# Patient Record
Sex: Male | Born: 1950 | Race: White | Hispanic: No | Marital: Married | Smoking: Never smoker
Health system: Southern US, Community
[De-identification: ages and names within clinical notes are randomized; demographics above are authoritative.]

## PROBLEM LIST (undated history)

## (undated) DIAGNOSIS — E7800 Pure hypercholesterolemia, unspecified: Secondary | ICD-10-CM

## (undated) DIAGNOSIS — E559 Vitamin D deficiency, unspecified: Secondary | ICD-10-CM

## (undated) DIAGNOSIS — M1990 Unspecified osteoarthritis, unspecified site: Secondary | ICD-10-CM

## (undated) DIAGNOSIS — K219 Gastro-esophageal reflux disease without esophagitis: Secondary | ICD-10-CM

## (undated) DIAGNOSIS — E538 Deficiency of other specified B group vitamins: Secondary | ICD-10-CM

## (undated) DIAGNOSIS — I10 Essential (primary) hypertension: Secondary | ICD-10-CM

## (undated) DIAGNOSIS — Z9109 Other allergy status, other than to drugs and biological substances: Secondary | ICD-10-CM

## (undated) HISTORY — PX: OTHER SURGICAL HISTORY: SHX169

## (undated) HISTORY — PX: COLONOSCOPY WITH PROPOFOL: SHX5780

## (undated) HISTORY — DX: Vitamin D deficiency, unspecified: E55.9

## (undated) HISTORY — DX: Deficiency of other specified B group vitamins: E53.8

## (undated) HISTORY — PX: SKIN GRAFT: SHX250

## (undated) HISTORY — DX: Essential (primary) hypertension: I10

## (undated) HISTORY — PX: TONSILLECTOMY: SUR1361

---

## 2012-02-17 ENCOUNTER — Other Ambulatory Visit: Payer: Self-pay | Admitting: Allergy and Immunology

## 2012-02-17 ENCOUNTER — Ambulatory Visit
Admission: RE | Admit: 2012-02-17 | Discharge: 2012-02-17 | Disposition: A | Discharge status: Home / Self Care | Payer: BC Managed Care – PPO | Source: Ambulatory Visit | Attending: Allergy and Immunology | Admitting: Allergy and Immunology

## 2012-02-17 DIAGNOSIS — R059 Cough, unspecified: Secondary | ICD-10-CM

## 2012-02-17 DIAGNOSIS — R05 Cough: Secondary | ICD-10-CM

## 2014-01-30 IMAGING — CR DG CHEST 2V
2 series · 2 of 2 positions shown · non-contrast
Comparison: None.

CLINICAL DATA: Cough, shortness of breath, former smoking history

CHEST - 2 VIEW

[w chest pa]
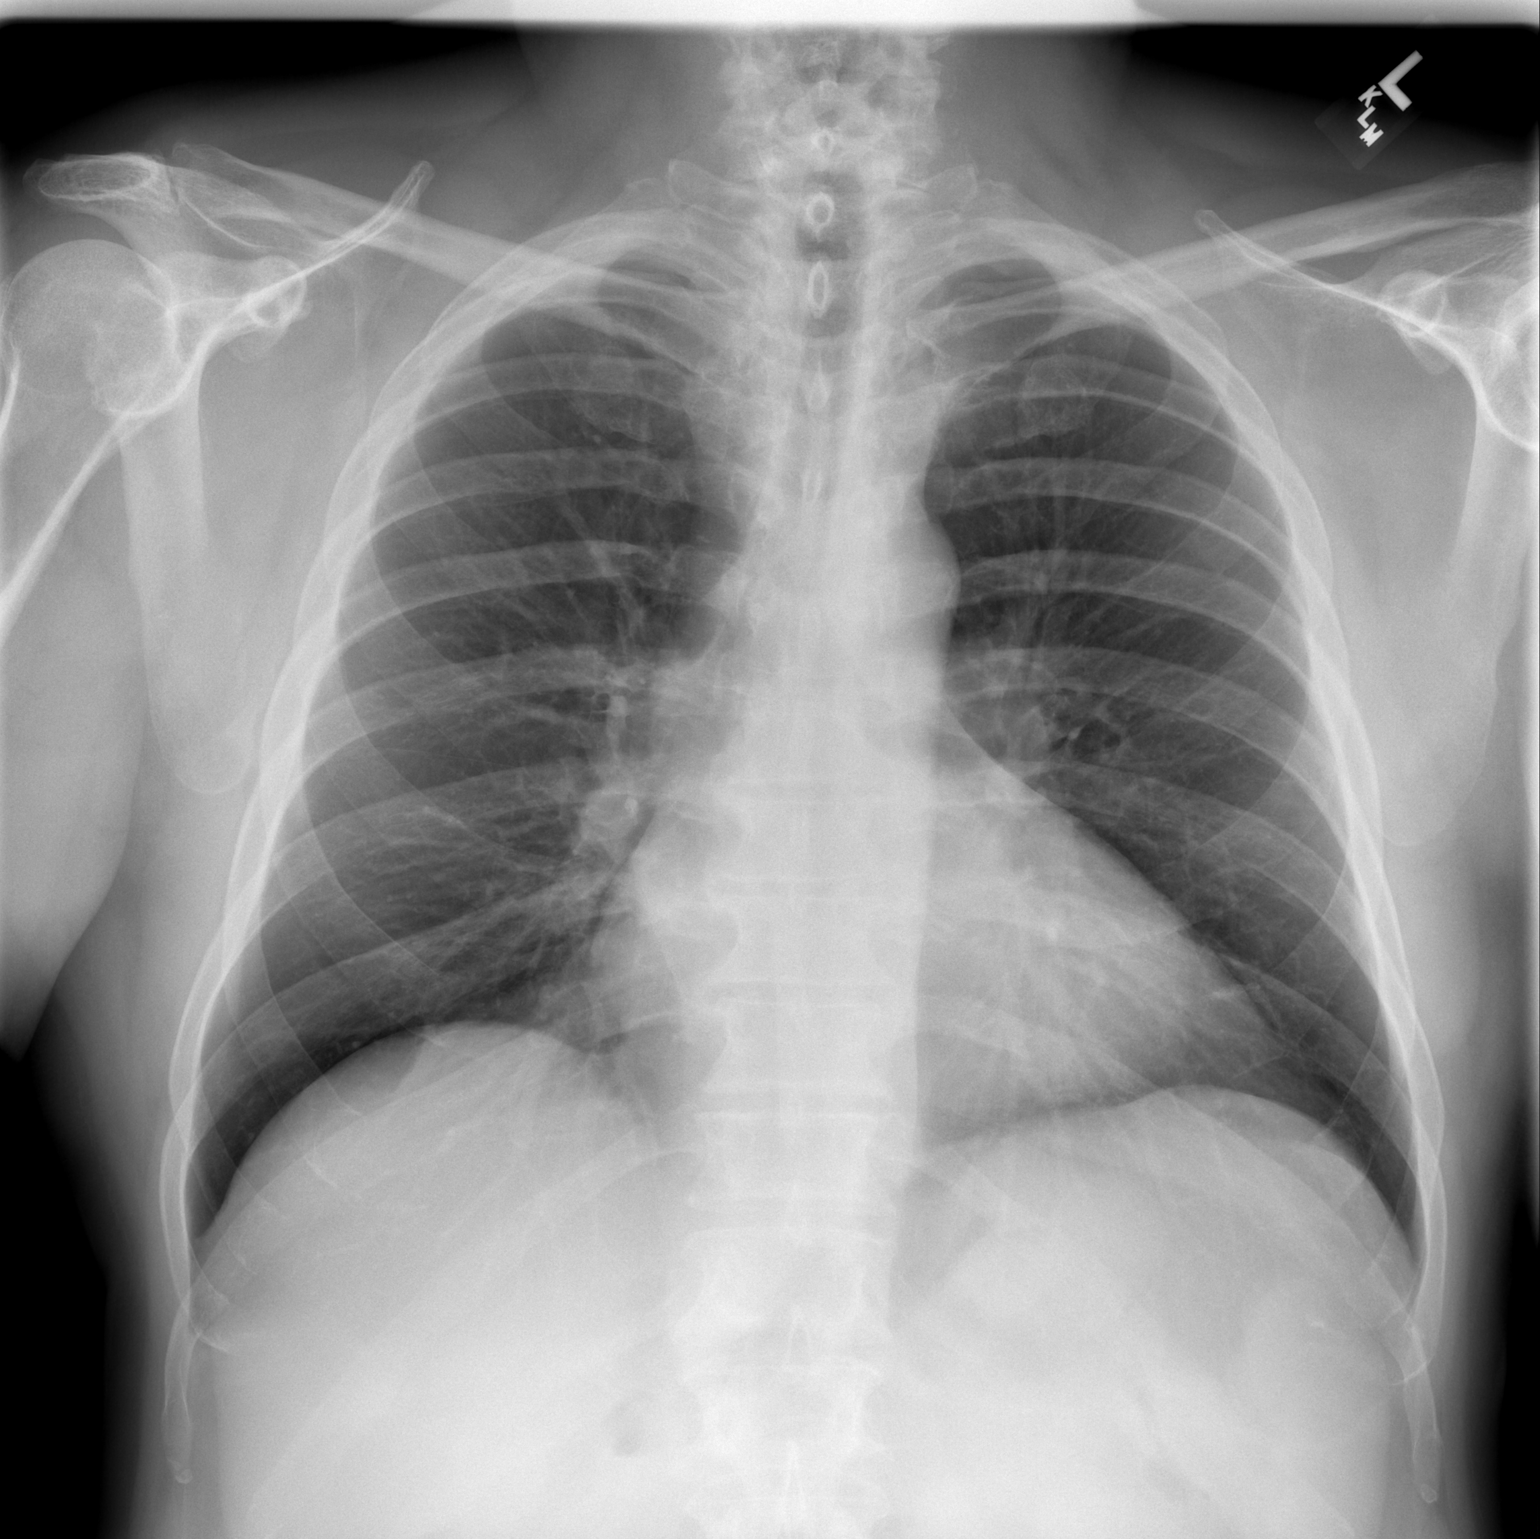

[w chest lat]
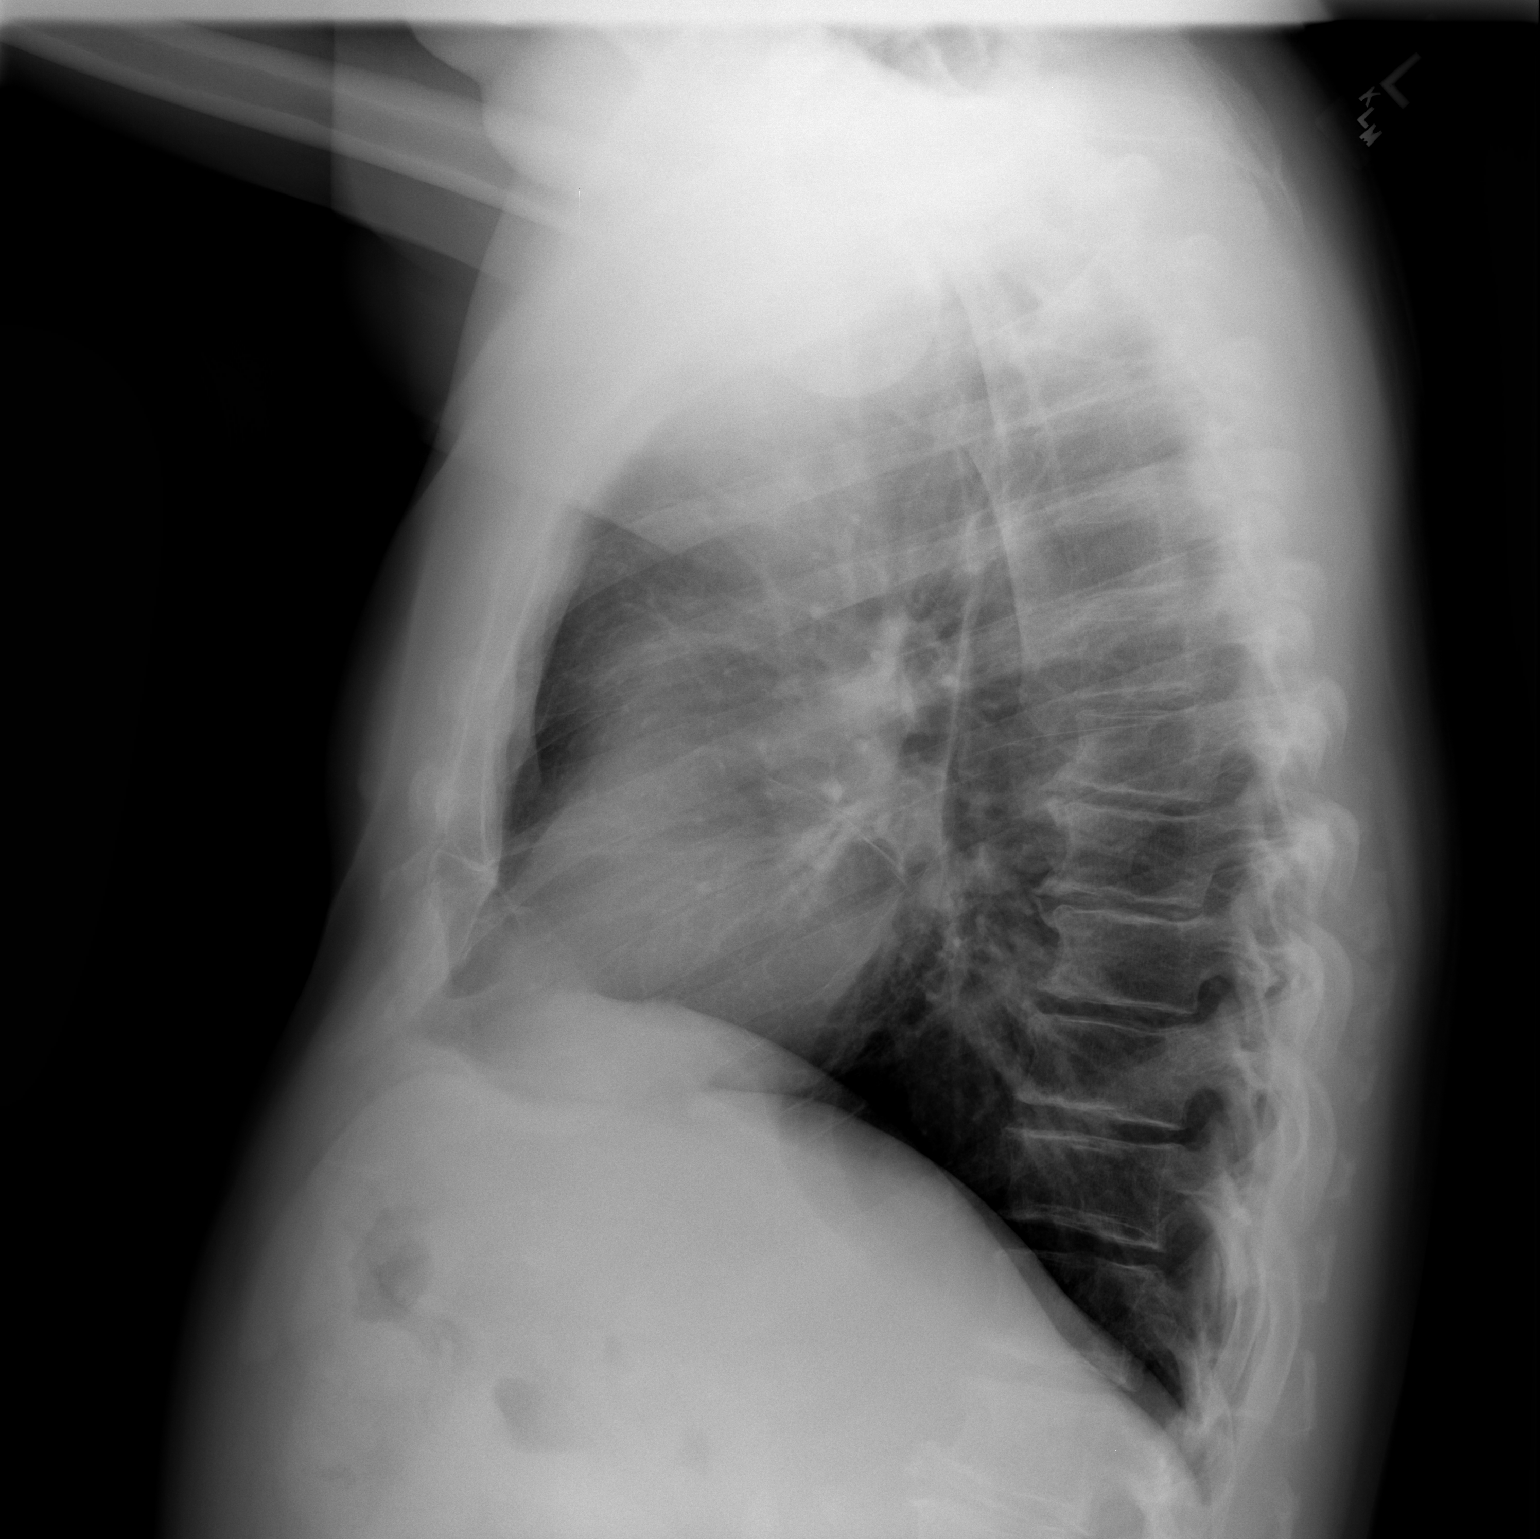

[2 of 2 positions shown; findings below may reference images not displayed]

FINDINGS: The lungs are clear.  Mediastinal contours appear normal.
The heart is within normal limits in size.  There are degenerative
changes throughout the thoracic spine.
IMPRESSION: No active lung disease.

## 2016-07-28 DIAGNOSIS — Z23 Encounter for immunization: Secondary | ICD-10-CM | POA: Diagnosis not present

## 2016-07-28 DIAGNOSIS — M109 Gout, unspecified: Secondary | ICD-10-CM | POA: Diagnosis not present

## 2016-07-28 DIAGNOSIS — R739 Hyperglycemia, unspecified: Secondary | ICD-10-CM | POA: Diagnosis not present

## 2016-07-28 DIAGNOSIS — M2550 Pain in unspecified joint: Secondary | ICD-10-CM | POA: Diagnosis not present

## 2016-07-28 DIAGNOSIS — K219 Gastro-esophageal reflux disease without esophagitis: Secondary | ICD-10-CM | POA: Diagnosis not present

## 2016-07-28 DIAGNOSIS — E559 Vitamin D deficiency, unspecified: Secondary | ICD-10-CM | POA: Diagnosis not present

## 2016-07-28 DIAGNOSIS — E7800 Pure hypercholesterolemia, unspecified: Secondary | ICD-10-CM | POA: Diagnosis not present

## 2016-07-28 DIAGNOSIS — J309 Allergic rhinitis, unspecified: Secondary | ICD-10-CM | POA: Diagnosis not present

## 2016-07-28 DIAGNOSIS — Z125 Encounter for screening for malignant neoplasm of prostate: Secondary | ICD-10-CM | POA: Diagnosis not present

## 2016-07-28 DIAGNOSIS — Z87898 Personal history of other specified conditions: Secondary | ICD-10-CM | POA: Diagnosis not present

## 2016-07-28 DIAGNOSIS — Z1389 Encounter for screening for other disorder: Secondary | ICD-10-CM | POA: Diagnosis not present

## 2016-07-28 DIAGNOSIS — G4700 Insomnia, unspecified: Secondary | ICD-10-CM | POA: Diagnosis not present

## 2016-07-28 DIAGNOSIS — Z0001 Encounter for general adult medical examination with abnormal findings: Secondary | ICD-10-CM | POA: Diagnosis not present

## 2016-09-11 ENCOUNTER — Other Ambulatory Visit: Payer: Self-pay | Admitting: Gastroenterology

## 2016-10-27 ENCOUNTER — Encounter (HOSPITAL_COMMUNITY): Payer: Self-pay | Admitting: *Deleted

## 2016-10-28 ENCOUNTER — Encounter (HOSPITAL_COMMUNITY)
Admission: RE | Disposition: A | Discharge status: Home / Self Care | Payer: Self-pay | Source: Ambulatory Visit | Attending: Gastroenterology

## 2016-10-28 ENCOUNTER — Encounter (HOSPITAL_COMMUNITY): Payer: Self-pay

## 2016-10-28 ENCOUNTER — Ambulatory Visit (HOSPITAL_COMMUNITY): Payer: Medicare HMO | Admitting: Anesthesiology

## 2016-10-28 ENCOUNTER — Ambulatory Visit (HOSPITAL_COMMUNITY)
Admission: RE | Admit: 2016-10-28 | Discharge: 2016-10-28 | Disposition: A | Discharge status: Home / Self Care | Payer: Medicare HMO | Source: Ambulatory Visit | Attending: Gastroenterology | Admitting: Gastroenterology

## 2016-10-28 DIAGNOSIS — M109 Gout, unspecified: Secondary | ICD-10-CM | POA: Insufficient documentation

## 2016-10-28 DIAGNOSIS — D123 Benign neoplasm of transverse colon: Secondary | ICD-10-CM | POA: Insufficient documentation

## 2016-10-28 DIAGNOSIS — K219 Gastro-esophageal reflux disease without esophagitis: Secondary | ICD-10-CM | POA: Insufficient documentation

## 2016-10-28 DIAGNOSIS — Z86010 Personal history of colonic polyps: Secondary | ICD-10-CM | POA: Diagnosis not present

## 2016-10-28 DIAGNOSIS — M1990 Unspecified osteoarthritis, unspecified site: Secondary | ICD-10-CM | POA: Diagnosis not present

## 2016-10-28 DIAGNOSIS — E7800 Pure hypercholesterolemia, unspecified: Secondary | ICD-10-CM | POA: Diagnosis not present

## 2016-10-28 DIAGNOSIS — Z1211 Encounter for screening for malignant neoplasm of colon: Secondary | ICD-10-CM | POA: Diagnosis not present

## 2016-10-28 HISTORY — DX: Unspecified osteoarthritis, unspecified site: M19.90

## 2016-10-28 HISTORY — DX: Other allergy status, other than to drugs and biological substances: Z91.09

## 2016-10-28 HISTORY — DX: Gastro-esophageal reflux disease without esophagitis: K21.9

## 2016-10-28 HISTORY — DX: Pure hypercholesterolemia, unspecified: E78.00

## 2016-10-28 HISTORY — PX: COLONOSCOPY WITH PROPOFOL: SHX5780

## 2016-10-28 SURGERY — COLONOSCOPY WITH PROPOFOL
Anesthesia: Monitor Anesthesia Care

## 2016-10-28 MED ORDER — SODIUM CHLORIDE 0.9 % IV SOLN: INTRAVENOUS | Status: DC

## 2016-10-28 MED ORDER — LACTATED RINGERS IV SOLN
INTRAVENOUS | Status: DC
Start: 2016-10-28 — End: 2016-10-28
  Administered 2016-10-28: 1000 mL via INTRAVENOUS
  Administered 2016-10-28: 10:00:00 via INTRAVENOUS

## 2016-10-28 MED ORDER — PROPOFOL 500 MG/50ML IV EMUL
INTRAVENOUS | Status: DC | PRN
  Administered 2016-10-28: 50 mg via INTRAVENOUS

## 2016-10-28 MED ORDER — PROPOFOL 10 MG/ML IV BOLUS
INTRAVENOUS | Status: AC
  Filled 2016-10-28: qty 40

## 2016-10-28 MED ORDER — PROPOFOL 500 MG/50ML IV EMUL
INTRAVENOUS | Status: DC | PRN
  Administered 2016-10-28: 150 ug/kg/min via INTRAVENOUS

## 2016-10-28 MED ORDER — PROPOFOL 10 MG/ML IV BOLUS
INTRAVENOUS | Status: AC
  Filled 2016-10-28: qty 20

## 2016-10-28 SURGICAL SUPPLY — 21 items

## 2016-10-28 NOTE — Op Note (Signed)
[facility] Patient Name: Luke Dalton Procedure Date: 10/28/2016 MRN: [id number] Attending MD: Garlan Fair , MD Date of Birth: 1950-10-16 CSN: [id number] Age: 66 Admit Type: Outpatient Procedure:                Colonoscopy Indications:              High risk colon cancer surveillance: 10/22/2010                            Baseline screening colonoscopy was performed with                            removal of a 3 mm tubular adenomatous descending                            colon polyp Providers:                Garlan Fair, MD, Laverta Baltimore RN, RN,                            Cherylynn Ridges, Technician, Arnoldo Hooker, CRNA Referring MD:              Medicines:                Propofol per Anesthesia Complications:            No immediate complications. Estimated Blood Loss:     Estimated blood loss was minimal. Procedure:                Pre-Anesthesia Assessment:                           - Prior to the procedure, a History and Physical                            was performed, and patient medications and                            allergies were reviewed. The patient's tolerance of                            previous anesthesia was also reviewed. The risks                            and benefits of the procedure and the sedation                            options and risks were discussed with the patient.                            All questions were answered, and informed consent                            was obtained. Prior Anticoagulants: The patient has  taken no previous anticoagulant or antiplatelet                            agents. ASA Grade Assessment: II - A patient with                            mild systemic disease. After reviewing the risks                            and benefits, the patient was deemed in                            satisfactory condition to undergo the procedure.                           After  obtaining informed consent, the colonoscope                            was passed under direct vision. Throughout the                            procedure, the patient's blood pressure, pulse, and                            oxygen saturations were monitored continuously. The                            EC-[id number] ([id number]) scope was introduced through                            the anus and advanced to the the cecum, identified                            by appendiceal orifice and ileocecal valve. The                            colonoscopy was performed without difficulty. The                            patient tolerated the procedure well. The quality                            of the bowel preparation was good. The terminal                            ileum, the ileocecal valve, the appendiceal orifice                            and the rectum were photographed. Scope In: 9:52:42 AM Scope Out: 10:08:54 AM Scope Withdrawal Time: 0 hours 12 minutes 18 seconds  Total Procedure Duration: 0 hours 16 minutes 12 seconds  Findings:      The perianal and digital rectal examinations were normal.      Two sessile polyps were found  in the proximal transverse colon. The       polyps were 5 mm in size. These polyps were removed with a cold snare.       Resection and retrieval were complete.      The exam was otherwise without abnormality. Impression:               - Two 5 mm polyps in the proximal transverse colon,                            removed with a cold snare. Resected and retrieved.                           - The examination was otherwise normal. Moderate Sedation:      N/A- Per Anesthesia Care Recommendation:           - Patient has a contact number available for                            emergencies. The signs and symptoms of potential                            delayed complications were discussed with the                            patient. Return to normal activities tomorrow.                             Written discharge instructions were provided to the                            patient.                           - Repeat colonoscopy date to be determined after                            pending pathology results are reviewed for                            surveillance.                           - Resume previous diet.                           - Continue present medications. Procedure Code(s):        --- Professional ---                           [phone], Colonoscopy, flexible; with removal of                            tumor(s), polyp(s), or other lesion(s) by snare                            technique Diagnosis Code(s):        ---  Professional ---                           Z86.010, Personal history of colonic polyps                           D12.3, Benign neoplasm of transverse colon (hepatic                            flexure or splenic flexure) CPT copyright 2016 American [employer] Association. All rights reserved. The codes documented in this report are preliminary and upon coder review may  be revised to meet current compliance requirements. Earle Gell, MD Garlan Fair, MD 10/28/2016 10:16:06 AM This report has been signed electronically. Number of Addenda: 0

## 2016-10-28 NOTE — H&P (Signed)
Procedure: Surveillance colonoscopy. 10/22/2010 Baseline screening colonoscopy was performed with removal of a family millimeters tubular adenomatous ascending colon polyp  History: The patient is a 67 year old male born [birth year]. He is scheduled to undergo a surveillance colonoscopy today.  Past medical history: Tonsillectomy. Her cholesterolemia. Osteoarthritis of the lower back. Allergic rhinitis. Gastroesophageal reflux. Gout.  Exam: Patient is alert and lying comfortably on the endoscopy stretcher. Abdomen is soft and nontender to palpation. Lungs are clear to auscultation. Cardiac exam reveals a regular rhythm.  Plan: Proceed with surveillance colonoscopy

## 2016-10-28 NOTE — Discharge Instructions (Signed)

## 2016-10-28 NOTE — Transfer of Care (Signed)
Immediate Anesthesia Transfer of Care Note  Patient: Luke Dalton  Procedure(s) Performed: Procedure(s): COLONOSCOPY WITH PROPOFOL (N/A)  Patient Location: PACU  Anesthesia Type:MAC  Level of Consciousness:  sedated, patient cooperative and responds to stimulation  Airway & Oxygen Therapy:Patient Spontanous Breathing and Patient connected to face mask oxgen  Post-op Assessment:  Report given to PACU RN and Post -op Vital signs reviewed and stable  Post vital signs:  Reviewed and stable  Last Vitals:  Vitals:   10/28/16 0931  BP: (!) 152/71  Pulse: 79  Resp: 13  Temp: 25.4 C    Complications: No apparent anesthesia complications

## 2016-10-28 NOTE — Anesthesia Preprocedure Evaluation (Signed)
Anesthesia Evaluation  Patient identified by MRN, date of birth, ID band Patient awake    Reviewed: Allergy & Precautions, NPO status , Patient's Chart, lab work & pertinent test results  Airway Mallampati: II  TM Distance: >3 FB Neck ROM: Full    Dental no notable dental hx.    Pulmonary neg pulmonary ROS,    Pulmonary exam normal breath sounds clear to auscultation       Cardiovascular negative cardio ROS Normal cardiovascular exam Rhythm:Regular Rate:Normal     Neuro/Psych negative neurological ROS  negative psych ROS   GI/Hepatic negative GI ROS, Neg liver ROS,   Endo/Other  negative endocrine ROS  Renal/GU negative Renal ROS  negative genitourinary   Musculoskeletal negative musculoskeletal ROS (+)   Abdominal   Peds negative pediatric ROS (+)  Hematology negative hematology ROS (+)   Anesthesia Other Findings   Reproductive/Obstetrics negative OB ROS                             Anesthesia Physical Anesthesia Plan  ASA: II  Anesthesia Plan: MAC   Post-op Pain Management:    Induction: Intravenous  Airway Management Planned: Nasal Cannula  Additional Equipment:   Intra-op Plan:   Post-operative Plan:   Informed Consent: I have reviewed the patients History and Physical, chart, labs and discussed the procedure including the risks, benefits and alternatives for the proposed anesthesia with the patient or authorized representative who has indicated his/her understanding and acceptance.   Dental advisory given  Plan Discussed with: CRNA and Surgeon  Anesthesia Plan Comments:         Anesthesia Quick Evaluation  

## 2016-10-29 NOTE — Anesthesia Postprocedure Evaluation (Signed)
Anesthesia Post Note  Patient: Luke Dalton  Procedure(s) Performed: Procedure(s) (LRB): COLONOSCOPY WITH PROPOFOL (N/A)  Patient location during evaluation: PACU Anesthesia Type: MAC Level of consciousness: awake and alert Pain management: pain level controlled Vital Signs Assessment: post-procedure vital signs reviewed and stable Respiratory status: spontaneous breathing, nonlabored ventilation, respiratory function stable and patient connected to nasal cannula oxygen Cardiovascular status: stable and blood pressure returned to baseline Anesthetic complications: no       Last Vitals:  Vitals:   10/28/16 1030 10/28/16 1040  BP: 139/64 (!) 150/68  Pulse: (!) 51 (!) 50  Resp: (!) 25 16  Temp:      Last Pain:  Vitals:   10/28/16 1014  TempSrc: Oral                 [name]

## 2016-10-30 ENCOUNTER — Encounter (HOSPITAL_COMMUNITY): Payer: Self-pay | Admitting: Gastroenterology

## 2016-12-01 NOTE — Anesthesia Postprocedure Evaluation (Signed)
Anesthesia Post Note  Patient: Luke Dalton  Procedure(s) Performed: Procedure(s) (LRB): COLONOSCOPY WITH PROPOFOL (N/A)     Anesthesia Post Evaluation  Last Vitals:  Vitals:   10/28/16 1030 10/28/16 1040  BP: 139/64 (!) 150/68  Pulse: (!) 51 (!) 50  Resp: (!) 25 16  Temp:      Last Pain:  Vitals:   10/28/16 1014  TempSrc: Oral                 Braileigh Landenberger S

## 2016-12-01 NOTE — Addendum Note (Signed)
Addendum  created 12/01/16 1411 by Myrtie Soman, MD   Sign clinical note

## 2017-03-27 DIAGNOSIS — Z0100 Encounter for examination of eyes and vision without abnormal findings: Secondary | ICD-10-CM | POA: Diagnosis not present

## 2017-03-27 DIAGNOSIS — H524 Presbyopia: Secondary | ICD-10-CM | POA: Diagnosis not present

## 2017-04-10 DIAGNOSIS — R69 Illness, unspecified: Secondary | ICD-10-CM | POA: Diagnosis not present

## 2017-07-30 DIAGNOSIS — E559 Vitamin D deficiency, unspecified: Secondary | ICD-10-CM | POA: Diagnosis not present

## 2017-07-30 DIAGNOSIS — R202 Paresthesia of skin: Secondary | ICD-10-CM | POA: Diagnosis not present

## 2017-07-30 DIAGNOSIS — E7800 Pure hypercholesterolemia, unspecified: Secondary | ICD-10-CM | POA: Diagnosis not present

## 2017-07-30 DIAGNOSIS — H9193 Unspecified hearing loss, bilateral: Secondary | ICD-10-CM | POA: Diagnosis not present

## 2017-07-30 DIAGNOSIS — Z0000 Encounter for general adult medical examination without abnormal findings: Secondary | ICD-10-CM | POA: Diagnosis not present

## 2017-07-30 DIAGNOSIS — K219 Gastro-esophageal reflux disease without esophagitis: Secondary | ICD-10-CM | POA: Diagnosis not present

## 2017-07-30 DIAGNOSIS — G629 Polyneuropathy, unspecified: Secondary | ICD-10-CM | POA: Diagnosis not present

## 2017-07-30 DIAGNOSIS — Z86010 Personal history of colonic polyps: Secondary | ICD-10-CM | POA: Diagnosis not present

## 2017-07-30 DIAGNOSIS — Z125 Encounter for screening for malignant neoplasm of prostate: Secondary | ICD-10-CM | POA: Diagnosis not present

## 2017-07-30 DIAGNOSIS — Z1389 Encounter for screening for other disorder: Secondary | ICD-10-CM | POA: Diagnosis not present

## 2018-01-08 DIAGNOSIS — H2513 Age-related nuclear cataract, bilateral: Secondary | ICD-10-CM | POA: Diagnosis not present

## 2018-01-08 DIAGNOSIS — H43811 Vitreous degeneration, right eye: Secondary | ICD-10-CM | POA: Diagnosis not present

## 2018-01-12 ENCOUNTER — Encounter (INDEPENDENT_AMBULATORY_CARE_PROVIDER_SITE_OTHER): Payer: Medicare HMO | Admitting: Ophthalmology

## 2018-01-12 DIAGNOSIS — D3131 Benign neoplasm of right choroid: Secondary | ICD-10-CM

## 2018-01-12 DIAGNOSIS — H43813 Vitreous degeneration, bilateral: Secondary | ICD-10-CM | POA: Diagnosis not present

## 2018-01-28 DIAGNOSIS — H10411 Chronic giant papillary conjunctivitis, right eye: Secondary | ICD-10-CM | POA: Diagnosis not present

## 2018-02-25 DIAGNOSIS — R69 Illness, unspecified: Secondary | ICD-10-CM | POA: Diagnosis not present

## 2018-03-23 DIAGNOSIS — R69 Illness, unspecified: Secondary | ICD-10-CM | POA: Diagnosis not present

## 2018-03-24 DIAGNOSIS — Z23 Encounter for immunization: Secondary | ICD-10-CM | POA: Diagnosis not present

## 2018-05-10 DIAGNOSIS — R202 Paresthesia of skin: Secondary | ICD-10-CM | POA: Diagnosis not present

## 2018-05-13 DIAGNOSIS — G629 Polyneuropathy, unspecified: Secondary | ICD-10-CM | POA: Diagnosis not present

## 2018-05-24 DIAGNOSIS — R202 Paresthesia of skin: Secondary | ICD-10-CM | POA: Diagnosis not present

## 2018-08-04 DIAGNOSIS — E7800 Pure hypercholesterolemia, unspecified: Secondary | ICD-10-CM | POA: Diagnosis not present

## 2018-08-04 DIAGNOSIS — Z79899 Other long term (current) drug therapy: Secondary | ICD-10-CM | POA: Diagnosis not present

## 2018-08-04 DIAGNOSIS — R202 Paresthesia of skin: Secondary | ICD-10-CM | POA: Diagnosis not present

## 2018-08-04 DIAGNOSIS — Z0001 Encounter for general adult medical examination with abnormal findings: Secondary | ICD-10-CM | POA: Diagnosis not present

## 2018-08-04 DIAGNOSIS — E559 Vitamin D deficiency, unspecified: Secondary | ICD-10-CM | POA: Diagnosis not present

## 2018-08-04 DIAGNOSIS — M109 Gout, unspecified: Secondary | ICD-10-CM | POA: Diagnosis not present

## 2018-08-04 DIAGNOSIS — Z1389 Encounter for screening for other disorder: Secondary | ICD-10-CM | POA: Diagnosis not present

## 2018-08-04 DIAGNOSIS — R7309 Other abnormal glucose: Secondary | ICD-10-CM | POA: Diagnosis not present

## 2018-08-04 DIAGNOSIS — G629 Polyneuropathy, unspecified: Secondary | ICD-10-CM | POA: Diagnosis not present

## 2018-08-04 DIAGNOSIS — Z86010 Personal history of colonic polyps: Secondary | ICD-10-CM | POA: Diagnosis not present

## 2018-09-07 DIAGNOSIS — L739 Follicular disorder, unspecified: Secondary | ICD-10-CM | POA: Diagnosis not present

## 2018-09-07 DIAGNOSIS — L281 Prurigo nodularis: Secondary | ICD-10-CM | POA: Diagnosis not present

## 2018-09-07 DIAGNOSIS — D229 Melanocytic nevi, unspecified: Secondary | ICD-10-CM | POA: Diagnosis not present

## 2019-04-14 DIAGNOSIS — Z23 Encounter for immunization: Secondary | ICD-10-CM | POA: Diagnosis not present

## 2019-07-21 ENCOUNTER — Other Ambulatory Visit: Payer: Medicare HMO

## 2019-07-27 DIAGNOSIS — H524 Presbyopia: Secondary | ICD-10-CM | POA: Diagnosis not present

## 2019-07-27 DIAGNOSIS — H5213 Myopia, bilateral: Secondary | ICD-10-CM | POA: Diagnosis not present

## 2019-07-27 DIAGNOSIS — H52223 Regular astigmatism, bilateral: Secondary | ICD-10-CM | POA: Diagnosis not present

## 2019-07-31 ENCOUNTER — Ambulatory Visit: Payer: Medicare HMO

## 2019-08-04 ENCOUNTER — Ambulatory Visit: Payer: Medicare HMO

## 2019-08-11 ENCOUNTER — Ambulatory Visit: Payer: Medicare HMO

## 2019-10-24 DIAGNOSIS — R3915 Urgency of urination: Secondary | ICD-10-CM | POA: Diagnosis not present

## 2019-10-24 DIAGNOSIS — R3121 Asymptomatic microscopic hematuria: Secondary | ICD-10-CM | POA: Diagnosis not present

## 2019-11-08 DIAGNOSIS — R3121 Asymptomatic microscopic hematuria: Secondary | ICD-10-CM | POA: Diagnosis not present

## 2019-11-08 DIAGNOSIS — R3915 Urgency of urination: Secondary | ICD-10-CM | POA: Diagnosis not present

## 2020-01-17 DIAGNOSIS — G4700 Insomnia, unspecified: Secondary | ICD-10-CM | POA: Diagnosis not present

## 2020-01-17 DIAGNOSIS — M109 Gout, unspecified: Secondary | ICD-10-CM | POA: Diagnosis not present

## 2020-01-17 DIAGNOSIS — Z79899 Other long term (current) drug therapy: Secondary | ICD-10-CM | POA: Diagnosis not present

## 2020-01-17 DIAGNOSIS — R202 Paresthesia of skin: Secondary | ICD-10-CM | POA: Diagnosis not present

## 2020-01-17 DIAGNOSIS — R7309 Other abnormal glucose: Secondary | ICD-10-CM | POA: Diagnosis not present

## 2020-01-17 DIAGNOSIS — G629 Polyneuropathy, unspecified: Secondary | ICD-10-CM | POA: Diagnosis not present

## 2020-01-17 DIAGNOSIS — Z0001 Encounter for general adult medical examination with abnormal findings: Secondary | ICD-10-CM | POA: Diagnosis not present

## 2020-01-17 DIAGNOSIS — H9313 Tinnitus, bilateral: Secondary | ICD-10-CM | POA: Diagnosis not present

## 2020-01-17 DIAGNOSIS — E7800 Pure hypercholesterolemia, unspecified: Secondary | ICD-10-CM | POA: Diagnosis not present

## 2020-01-17 DIAGNOSIS — E559 Vitamin D deficiency, unspecified: Secondary | ICD-10-CM | POA: Diagnosis not present

## 2020-02-03 DIAGNOSIS — R3915 Urgency of urination: Secondary | ICD-10-CM | POA: Diagnosis not present

## 2020-03-26 DIAGNOSIS — R69 Illness, unspecified: Secondary | ICD-10-CM | POA: Diagnosis not present

## 2020-09-05 DIAGNOSIS — R634 Abnormal weight loss: Secondary | ICD-10-CM | POA: Diagnosis not present

## 2020-10-10 DIAGNOSIS — H2513 Age-related nuclear cataract, bilateral: Secondary | ICD-10-CM | POA: Diagnosis not present

## 2020-10-10 DIAGNOSIS — D4981 Neoplasm of unspecified behavior of retina and choroid: Secondary | ICD-10-CM | POA: Diagnosis not present

## 2020-10-10 DIAGNOSIS — H40053 Ocular hypertension, bilateral: Secondary | ICD-10-CM | POA: Diagnosis not present

## 2020-10-10 DIAGNOSIS — H10411 Chronic giant papillary conjunctivitis, right eye: Secondary | ICD-10-CM | POA: Diagnosis not present

## 2021-02-20 DIAGNOSIS — Z79899 Other long term (current) drug therapy: Secondary | ICD-10-CM | POA: Diagnosis not present

## 2021-02-20 DIAGNOSIS — Z0001 Encounter for general adult medical examination with abnormal findings: Secondary | ICD-10-CM | POA: Diagnosis not present

## 2021-02-20 DIAGNOSIS — R202 Paresthesia of skin: Secondary | ICD-10-CM | POA: Diagnosis not present

## 2021-02-20 DIAGNOSIS — E7800 Pure hypercholesterolemia, unspecified: Secondary | ICD-10-CM | POA: Diagnosis not present

## 2021-02-20 DIAGNOSIS — R634 Abnormal weight loss: Secondary | ICD-10-CM | POA: Diagnosis not present

## 2021-02-20 DIAGNOSIS — K219 Gastro-esophageal reflux disease without esophagitis: Secondary | ICD-10-CM | POA: Diagnosis not present

## 2021-02-20 DIAGNOSIS — M109 Gout, unspecified: Secondary | ICD-10-CM | POA: Diagnosis not present

## 2021-02-20 DIAGNOSIS — Z86010 Personal history of colonic polyps: Secondary | ICD-10-CM | POA: Diagnosis not present

## 2021-02-20 DIAGNOSIS — E559 Vitamin D deficiency, unspecified: Secondary | ICD-10-CM | POA: Diagnosis not present

## 2021-02-20 DIAGNOSIS — G629 Polyneuropathy, unspecified: Secondary | ICD-10-CM | POA: Diagnosis not present

## 2021-08-27 DIAGNOSIS — H524 Presbyopia: Secondary | ICD-10-CM | POA: Diagnosis not present

## 2021-08-27 DIAGNOSIS — H43811 Vitreous degeneration, right eye: Secondary | ICD-10-CM | POA: Diagnosis not present

## 2021-08-27 DIAGNOSIS — H2513 Age-related nuclear cataract, bilateral: Secondary | ICD-10-CM | POA: Diagnosis not present

## 2021-08-27 DIAGNOSIS — D3121 Benign neoplasm of right retina: Secondary | ICD-10-CM | POA: Diagnosis not present

## 2021-08-27 DIAGNOSIS — H40053 Ocular hypertension, bilateral: Secondary | ICD-10-CM | POA: Diagnosis not present

## 2022-02-04 DIAGNOSIS — N401 Enlarged prostate with lower urinary tract symptoms: Secondary | ICD-10-CM | POA: Diagnosis not present

## 2022-02-04 DIAGNOSIS — Z125 Encounter for screening for malignant neoplasm of prostate: Secondary | ICD-10-CM | POA: Diagnosis not present

## 2022-02-04 DIAGNOSIS — R3915 Urgency of urination: Secondary | ICD-10-CM | POA: Diagnosis not present

## 2022-02-25 DIAGNOSIS — H9193 Unspecified hearing loss, bilateral: Secondary | ICD-10-CM | POA: Diagnosis not present

## 2022-02-25 DIAGNOSIS — R739 Hyperglycemia, unspecified: Secondary | ICD-10-CM | POA: Diagnosis not present

## 2022-02-25 DIAGNOSIS — Z86010 Personal history of colonic polyps: Secondary | ICD-10-CM | POA: Diagnosis not present

## 2022-02-25 DIAGNOSIS — E559 Vitamin D deficiency, unspecified: Secondary | ICD-10-CM | POA: Diagnosis not present

## 2022-02-25 DIAGNOSIS — Z0001 Encounter for general adult medical examination with abnormal findings: Secondary | ICD-10-CM | POA: Diagnosis not present

## 2022-02-25 DIAGNOSIS — E7800 Pure hypercholesterolemia, unspecified: Secondary | ICD-10-CM | POA: Diagnosis not present

## 2022-02-25 DIAGNOSIS — K219 Gastro-esophageal reflux disease without esophagitis: Secondary | ICD-10-CM | POA: Diagnosis not present

## 2022-02-25 DIAGNOSIS — Z0282 Encounter for adoption services: Secondary | ICD-10-CM | POA: Diagnosis not present

## 2022-02-25 DIAGNOSIS — Z79899 Other long term (current) drug therapy: Secondary | ICD-10-CM | POA: Diagnosis not present

## 2022-02-25 DIAGNOSIS — G4700 Insomnia, unspecified: Secondary | ICD-10-CM | POA: Diagnosis not present

## 2022-02-25 DIAGNOSIS — H9313 Tinnitus, bilateral: Secondary | ICD-10-CM | POA: Diagnosis not present

## 2022-02-25 DIAGNOSIS — R7309 Other abnormal glucose: Secondary | ICD-10-CM | POA: Diagnosis not present

## 2022-02-25 DIAGNOSIS — R202 Paresthesia of skin: Secondary | ICD-10-CM | POA: Diagnosis not present

## 2022-02-25 DIAGNOSIS — M109 Gout, unspecified: Secondary | ICD-10-CM | POA: Diagnosis not present

## 2022-03-19 DIAGNOSIS — R202 Paresthesia of skin: Secondary | ICD-10-CM | POA: Diagnosis not present

## 2022-04-05 ENCOUNTER — Other Ambulatory Visit: Payer: Self-pay

## 2022-04-05 ENCOUNTER — Emergency Department (HOSPITAL_COMMUNITY): Payer: Medicare HMO

## 2022-04-05 ENCOUNTER — Emergency Department (HOSPITAL_COMMUNITY)
Admission: EM | Admit: 2022-04-05 | Discharge: 2022-04-05 | Disposition: A | Discharge status: Home / Self Care | Payer: Medicare HMO | Attending: Emergency Medicine | Admitting: Emergency Medicine

## 2022-04-05 ENCOUNTER — Encounter (HOSPITAL_COMMUNITY): Payer: Self-pay

## 2022-04-05 DIAGNOSIS — Y990 Civilian activity done for income or pay: Secondary | ICD-10-CM | POA: Insufficient documentation

## 2022-04-05 DIAGNOSIS — S61313A Laceration without foreign body of left middle finger with damage to nail, initial encounter: Secondary | ICD-10-CM

## 2022-04-05 DIAGNOSIS — S6992XA Unspecified injury of left wrist, hand and finger(s), initial encounter: Secondary | ICD-10-CM | POA: Diagnosis not present

## 2022-04-05 DIAGNOSIS — W232XXA Caught, crushed, jammed or pinched between a moving and stationary object, initial encounter: Secondary | ICD-10-CM | POA: Diagnosis not present

## 2022-04-05 DIAGNOSIS — S68623A Partial traumatic transphalangeal amputation of left middle finger, initial encounter: Secondary | ICD-10-CM | POA: Diagnosis not present

## 2022-04-05 DIAGNOSIS — S62629A Displaced fracture of medial phalanx of unspecified finger, initial encounter for closed fracture: Secondary | ICD-10-CM | POA: Diagnosis not present

## 2022-04-05 DIAGNOSIS — T148XXA Other injury of unspecified body region, initial encounter: Secondary | ICD-10-CM | POA: Diagnosis not present

## 2022-04-05 DIAGNOSIS — S68613A Complete traumatic transphalangeal amputation of left middle finger, initial encounter: Secondary | ICD-10-CM | POA: Diagnosis not present

## 2022-04-05 DIAGNOSIS — S61213A Laceration without foreign body of left middle finger without damage to nail, initial encounter: Secondary | ICD-10-CM | POA: Diagnosis not present

## 2022-04-05 HISTORY — PX: REVISION AMPUTATION OF FINGER: SHX2346

## 2022-04-05 MED ORDER — BUPIVACAINE HCL (PF) 0.5 % IJ SOLN
10.0000 mL | Freq: Once | INTRAMUSCULAR | Status: AC
  Administered 2022-04-05: 10 mL
  Filled 2022-04-05: qty 30

## 2022-04-05 MED ORDER — LIDOCAINE HCL (PF) 1 % IJ SOLN
5.0000 mL | Freq: Once | INTRAMUSCULAR | Status: AC
Start: 2022-04-05 — End: 2022-04-05
  Administered 2022-04-05: 5 mL
  Filled 2022-04-05: qty 30

## 2022-04-05 MED ORDER — CEPHALEXIN 500 MG PO CAPS: 500.0000 mg | ORAL_CAPSULE | Freq: Four times a day (QID) | ORAL | 0 refills | Status: DC

## 2022-04-05 MED ORDER — CEFAZOLIN SODIUM-DEXTROSE 1-4 GM/50ML-% IV SOLN
1.0000 g | Freq: Once | INTRAVENOUS | Status: AC
  Administered 2022-04-05: 1 g via INTRAVENOUS
  Filled 2022-04-05: qty 50

## 2022-04-05 NOTE — Discharge Instructions (Addendum)
Antibiotic by the name of Keflex has been sent to the pharmacy.  Take 1 capsule every 6 hours for the next week.  Always take with plenty food and water.  Take until the course has been completed.  Dr. Alexis Goodell office contact information has been provided for you.  Please call to schedule your follow-up appointment for this coming Friday.  Return to the ED for any new or worsening symptoms.  Continue to manage pain with ibuprofen and Tylenol.  See his wound instructions below.   Dial soap soaks  Supplies needed: -Liquid Dial Antibacterial soap -clean basin large enough to submerge wound (to be rinsed and dried between uses) -clean dry towel -Over-the-counter triple antibiotic ointment (neosporin or similar) -Over-the-counter Petroleum impregnated dressing (Xeroform or similar) -Sterile gauze -comfortable tape or self adhesive wrap   Perform 2-3 times per day: Soak wound in soapy water for 10-15 minutes in clean basin Gently pat dry Cover wound generously with bacitracin/neosporin or plain Vaseline (petroleum gel) Place petroleum impregnated dressing over wound (Xeroform or similar) Cover with fresh sterile gauze and loose wrap

## 2022-04-05 NOTE — ED Provider Notes (Signed)
[facility] DEPT Provider Note   CSN: [id number] Arrival date & time: 04/05/22  1248     History  Chief Complaint  Patient presents with   Finger Injury    Luke Dalton is a 71 y.o. male presenting today with an acute injury to the left middle finger.  Was wood-working and using a saw 1 hour before arrival, when the tip of his finger was mistakenly shaved off.  Stated he looked down at the saw pinch and saw the tip of it lying directly to the side.  Denies significant pain, numbness, or tingling to the area.  Still able to move the joints of said finger without issues.  Recent Tdap earlier this year.  No recent antibiotic use.  No other injuries or complaints at this time.  The history is provided by the patient and medical records.       Home Medications Prior to Admission medications   Medication Sig Start Date End Date Taking? Authorizing Provider  cephALEXin (KEFLEX) 500 MG capsule Take 1 capsule (500 mg total) by mouth 4 (four) times daily. 04/05/22  Yes Prince Rome, PA-C  Ascorbic Acid (VITAMIN C) 1000 MG tablet Take 1,000 mg by mouth daily.    [provider]  montelukast (SINGULAIR) 10 MG tablet Take 10 mg by mouth every morning.    [provider]  Omega-3 Fatty Acids (FISH OIL PO) Take 1 capsule by mouth daily.    [provider]  simvastatin (ZOCOR) 20 MG tablet Take 20 mg by mouth at bedtime.    [provider]  Vitamin D, Ergocalciferol, (DRISDOL) 50000 units CAPS capsule Take 50,000 Units by mouth every 14 (fourteen) days.    [provider]      Allergies    Patient has no known allergies.    Review of Systems   Review of Systems  Musculoskeletal:        Injury to the left middle finger    Physical Exam Updated Vital Signs BP (!) 181/85   Pulse 84   Temp 97.7 F (36.5 C) (Oral)   Resp (!) 22   Ht 5' 10.5" (1.791 m)   Wt 87.5 kg   SpO2 99%   BMI 27.30 kg/m   Physical Exam Vitals and nursing note reviewed.  Constitutional:      General: He is not in acute distress.    Appearance: He is well-developed. He is not ill-appearing or toxic-appearing.  HENT:     Head: Normocephalic and atraumatic.  Eyes:     Conjunctiva/sclera: Conjunctivae normal.  Cardiovascular:     Rate and Rhythm: Normal rate and regular rhythm.     Heart sounds: No murmur heard. Pulmonary:     Effort: Pulmonary effort is normal. No respiratory distress.     Breath sounds: Normal breath sounds.  Abdominal:     Palpations: Abdomen is soft.     Tenderness: There is no abdominal tenderness.  Musculoskeletal:        General: No swelling.     Cervical back: Neck supple.     Comments: See photos.  ROM still intact of MCP, DIP, PIP of affected finger.  Bleeding controlled.  Digit appears neurovascularly intact.    Skin:    General: Skin is warm and dry.     Capillary Refill: Capillary refill takes less than 2 seconds.  Neurological:     Mental Status: He is alert and oriented to person, place, and time.  Psychiatric:  Mood and Affect: Mood normal.          ED Results / Procedures / Treatments   Labs (all labs ordered are listed, but only abnormal results are displayed) Labs Reviewed - No data to display  EKG None  Radiology DG Hand Complete Left  Result Date: 04/05/2022 CLINICAL DATA:  Left middle finger injury, laceration EXAM: LEFT HAND - COMPLETE 3+ VIEW COMPARISON:  None Available. FINDINGS: Laceration of the tip of the left middle finger. No visible underlying fracture. No subluxation or dislocation. No radiopaque foreign bodies. Degenerative changes at the 1st carpometacarpal joint and in the mid carpal region. IMPRESSION: Soft tissue amputation of the tip of the left little finger. No underlying fracture or radiopaque foreign body. Electronically Signed   By: Rolm Baptise M.D.   On: 04/05/2022 13:33    Procedures Procedures    Medications  Ordered in ED Medications  lidocaine (PF) (XYLOCAINE) 1 % injection 5 mL (has no administration in time range)  bupivacaine(PF) (MARCAINE) 0.5 % injection 10 mL (has no administration in time range)  ceFAZolin (ANCEF) IVPB 1 g/50 mL premix (1 g Intravenous New Bag/Given 04/05/22 1714)    ED Course/ Medical Decision Making/ A&P Clinical Course as of 04/05/22 1823  Sat Apr 05, 2022  1607 Consulted with [place] of orthopedic hand specialty.  Discussed patient case at length.  Plans to come see the patient for possible bedside minor surgical intervention.  Recommended 1 dose of Ancef with p.o. antibiotics at discharge. [AC]    Clinical Course User Index [AC] Prince Rome, PA-C                           Medical Decision Making Risk Prescription drug management.   70 y.o. male presents to the ED for concern of Finger Injury   This involves an extensive number of treatment options, and is a complaint that carries with it a high risk of complications and morbidity.  The emergent differential diagnosis prior to evaluation includes, but is not limited to: Amputation with or without bony involvement, laceration, abrasion, fracture, dislocation  This is not an exhaustive differential.   Past Medical History / Co-morbidities / Social History: Hx of hypercholesterolemia Social Determinants of Health include: Elderly  Additional History:  None  Lab Tests: None  Imaging Studies: I ordered imaging studies including XR left hand.   I independently visualized and interpreted imaging which showed amputation of the tip of the left middle finger with no obvious bony involvement I agree with the radiologist interpretation.  ED Course: Pt well-appearing on exam.  presenting today with an acute injury to the left middle finger.  Was wood-working and using a saw 1 hour before arrival, when the tip of his finger was mistakenly shaved off.  Stated he looked down at the saw pinch and saw the  tip of it lying directly to the side.  Denies significant pain, numbness, or tingling to the area.  Still able to move the joints of said finger without issues.  Recent Tdap earlier this year.  No recent antibiotic use.  No other injuries or complaints at this time.  Consulted with Dr. Ala Bent of orthopedic hand specialty, who came to assess the patient and provide bedside orthopedic services.  See his note for details.  1 dose of Ancef provided.  Plan to send home on Keflex.  Follow-up with Dr. Ala Bent next week on Friday for reevaluation and continued wound management  as instructed.  May continue to utilize ibuprofen and Tylenol as needed for pain management.  Patient in NAD and in good condition at time of discharge.  Disposition: After consideration the patient's encounter today, I do not feel today's workup suggests an emergent condition requiring admission or immediate intervention beyond what has been performed at this time.  Safe for discharge; instructed to return immediately for worsening symptoms, change in symptoms or any other concerns.  I have reviewed the patients home medicines and have made adjustments as needed.  Discussed course of treatment with the patient, whom demonstrated understanding.  Patient in agreement and has no further questions.     This chart was dictated using voice recognition software.  Despite best efforts to proofread, errors can occur which can change the documentation meaning.         Final Clinical Impression(s) / ED Diagnoses Final diagnoses:  Laceration of left middle finger without foreign body with damage to nail, initial encounter    Rx / DC Orders ED Discharge Orders          Ordered    cephALEXin (KEFLEX) 500 MG capsule  4 times daily        04/05/22 1821              Prince Rome, Hershal Coria [birth year] 1823    Hayden Rasmussen, MD 04/06/22 1747

## 2022-04-05 NOTE — ED Notes (Signed)
Hand surgeon at bedside. 

## 2022-04-05 NOTE — Procedures (Signed)
NAME: Luke Dalton RECORD NO: [id number] DATE OF BIRTH: 14-Oct-1950 FACILITY: [facility]   ED Procedure Note   DATE OF PROCEDURE: 04/05/22    PREOPERATIVE DIAGNOSIS:  Traumatic amputation of Left long finger tip   POSTOPERATIVE DIAGNOSIS:  same   PROCEDURE:  Revision amputation Left long finger   SURGEON:  Wadie Lessen, MD   ASSISTANT: none   ANESTHESIA:  Local   INTRAVENOUS FLUIDS:  none   ESTIMATED BLOOD LOSS:  Minimal.   COMPLICATIONS:  None.   SPECIMENS:  none   TOURNIQUET TIME:   15 minutes   DISPOSITION:  discharge from ED   INDICATIONS:  This gentleman sustained a finger tip amputation with exposed phalanx.  Risks, benefits and alternatives of surgery were discussed including the risks of blood loss, infection, damage to nerves, vessels, tendons, ligaments, bone for surgery, need for additional surgery, complications with wound healing, continued pain, stiffness, , need for repeat irrigation and debridement.  He voiced understanding of these risks and elected to proceed.  OPERATIVE COURSE:  After identifying the correct site and side, the hand was cleaned and a digital block of mix of 7cc 1% lidocaine and 3cc 0.5% bupivacaine was given.  The left hand and forearm were prepped with chloraprep and then the long finger was prepped in normal sterile fashion.  The tournicot was placed.  The wound was irrigated and then the bone was sharply debrided with rongeurs.  Nonviable soft tissue was sharply excised with scalpel.  The skin was then loosely approximated with 4-0 nylons.  The tournicot was removed.  The wound was covered with xeroform, cling wrap and an alumafoam splint then coban.    Plan for TID dial soap soaks and PO abx.  Follow up in clinic in 1 week.  Ellard Artis, MD Electronically signed, 04/05/22

## 2022-04-05 NOTE — ED Provider Triage Note (Signed)
Emergency Medicine Provider Triage Evaluation Note  Luke Dalton , a 71 y.o. male  was evaluated in triage.  Pt complains of left third finger injury.  Was using a planer approximately 10 AM when he got his finger caught.  Has an avulsion to the pad.  Went to walk-in clinic and obtain an x-ray.  This x-ray reportedly showed a bony avulsion and he was sent here.  Denies numbness, tingling, reduced range of motion.  Review of Systems  Positive: As above Negative: As above  Physical Exam  BP (!) 166/80 (BP Location: Right Arm)   Pulse 64   Temp 98.7 F (37.1 C) (Oral)   Resp 18   SpO2 98%  Gen:   Awake, no distress   Resp:  Normal effort  MSK:   Moves extremities without difficulty  Other:  Avulsion injury to the distal aspect of the left third finger.  Bleeding controlled in triage with pressure.  Medical Decision Making  Medically screening exam initiated at 1:22 PM.  Appropriate orders placed.  RYLEI CODISPOTI was informed that the remainder of the evaluation will be completed by another provider, this initial triage assessment does not replace that evaluation, and the importance of remaining in the ED until their evaluation is complete.     Roylene Reason, [place] 04/05/22 1323
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
## 2022-04-05 NOTE — ED Triage Notes (Signed)
Patient was using a wood planer and caught his left middle finger in the planer.

## 2022-04-10 ENCOUNTER — Telehealth: Payer: Self-pay

## 2022-04-10 NOTE — Telephone Encounter (Signed)
     Patient  visit on 10/7  at [place]  Have you been able to follow up with your primary care physician? YES  The patient was or was not able to obtain any needed medicine or equipment. YES  Are there diet recommendations that you are having difficulty following? NA  Patient expresses understanding of discharge instructions and education provided has no other needs at this time. YES     Hiawatha Dressel [facility], [place], Care Management  [phone] [address], [place], [place] [address] Phone: [phone] Email: [email or web address]    

## 2022-04-11 DIAGNOSIS — S68113A Complete traumatic metacarpophalangeal amputation of left middle finger, initial encounter: Secondary | ICD-10-CM | POA: Insufficient documentation

## 2022-05-19 DIAGNOSIS — R000 Tachycardia, unspecified: Secondary | ICD-10-CM | POA: Diagnosis not present

## 2022-05-19 DIAGNOSIS — I959 Hypotension, unspecified: Secondary | ICD-10-CM | POA: Diagnosis not present

## 2022-05-19 DIAGNOSIS — D7589 Other specified diseases of blood and blood-forming organs: Secondary | ICD-10-CM | POA: Diagnosis not present

## 2022-06-13 DIAGNOSIS — I10 Essential (primary) hypertension: Secondary | ICD-10-CM | POA: Diagnosis not present

## 2022-06-13 DIAGNOSIS — R519 Headache, unspecified: Secondary | ICD-10-CM | POA: Diagnosis not present

## 2023-01-22 ENCOUNTER — Other Ambulatory Visit: Payer: Self-pay | Admitting: Internal Medicine

## 2023-01-22 ENCOUNTER — Ambulatory Visit
Admission: RE | Admit: 2023-01-22 | Discharge: 2023-01-22 | Disposition: A | Discharge status: Home / Self Care | Payer: Medicare HMO | Source: Ambulatory Visit | Attending: Internal Medicine | Admitting: Internal Medicine

## 2023-01-22 DIAGNOSIS — M25562 Pain in left knee: Secondary | ICD-10-CM | POA: Diagnosis not present

## 2023-01-22 DIAGNOSIS — R2681 Unsteadiness on feet: Secondary | ICD-10-CM | POA: Diagnosis not present

## 2023-01-22 DIAGNOSIS — M25461 Effusion, right knee: Secondary | ICD-10-CM | POA: Diagnosis not present

## 2023-01-22 DIAGNOSIS — I10 Essential (primary) hypertension: Secondary | ICD-10-CM | POA: Diagnosis not present

## 2023-01-22 DIAGNOSIS — R202 Paresthesia of skin: Secondary | ICD-10-CM | POA: Diagnosis not present

## 2023-01-22 DIAGNOSIS — M1712 Unilateral primary osteoarthritis, left knee: Secondary | ICD-10-CM | POA: Diagnosis not present

## 2023-01-22 DIAGNOSIS — M25561 Pain in right knee: Secondary | ICD-10-CM | POA: Diagnosis not present

## 2023-01-27 ENCOUNTER — Encounter: Payer: Self-pay | Admitting: Neurology

## 2023-02-03 DIAGNOSIS — N401 Enlarged prostate with lower urinary tract symptoms: Secondary | ICD-10-CM | POA: Diagnosis not present

## 2023-02-03 DIAGNOSIS — R351 Nocturia: Secondary | ICD-10-CM | POA: Diagnosis not present

## 2023-02-04 ENCOUNTER — Encounter: Payer: Self-pay | Admitting: Neurology

## 2023-02-04 ENCOUNTER — Other Ambulatory Visit: Payer: Self-pay

## 2023-02-04 ENCOUNTER — Ambulatory Visit: Payer: Medicare HMO | Admitting: Neurology

## 2023-02-04 VITALS — BP 150/70 | HR 79 | Ht 71.0 in | Wt 199.0 lb

## 2023-02-04 DIAGNOSIS — G621 Alcoholic polyneuropathy: Secondary | ICD-10-CM

## 2023-02-04 DIAGNOSIS — R278 Other lack of coordination: Secondary | ICD-10-CM | POA: Diagnosis not present
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
## 2023-02-04 NOTE — Patient Instructions (Addendum)
Check labs  Refer you to physical therapy for balance training  Encouraged to try to cut back alcohol  Return to clinic as needed

## 2023-02-16 ENCOUNTER — Ambulatory Visit: Payer: Medicare HMO | Admitting: Surgical

## 2023-02-16 ENCOUNTER — Telehealth: Payer: Self-pay

## 2023-02-16 ENCOUNTER — Encounter: Payer: Self-pay | Admitting: Neurology

## 2023-02-16 DIAGNOSIS — M170 Bilateral primary osteoarthritis of knee: Secondary | ICD-10-CM

## 2023-02-16 DIAGNOSIS — M25562 Pain in left knee: Secondary | ICD-10-CM

## 2023-02-16 NOTE — Telephone Encounter (Signed)
Auth needed for bilat knee gel  

## 2023-02-17 NOTE — Telephone Encounter (Signed)
Yeah can you put PT order in for this location to just do 1-2 sessions of PT to design HEP for knee OA?

## 2023-02-17 NOTE — Telephone Encounter (Signed)
VOB submitted for Durolane, bilateral knee  

## 2023-02-17 NOTE — Telephone Encounter (Signed)
PT order placed in chart 

## 2023-02-20 ENCOUNTER — Encounter: Payer: Self-pay | Admitting: Surgical

## 2023-02-20 DIAGNOSIS — M25569 Pain in unspecified knee: Secondary | ICD-10-CM | POA: Diagnosis not present

## 2023-02-20 DIAGNOSIS — R2689 Other abnormalities of gait and mobility: Secondary | ICD-10-CM | POA: Diagnosis not present

## 2023-02-20 DIAGNOSIS — M6281 Muscle weakness (generalized): Secondary | ICD-10-CM | POA: Diagnosis not present

## 2023-02-20 MED ORDER — BUPIVACAINE HCL 0.25 % IJ SOLN
4.0000 mL | INTRAMUSCULAR | Status: AC | PRN
Start: 2023-02-16 — End: 2023-02-16
  Administered 2023-02-16: 4 mL via INTRA_ARTICULAR

## 2023-02-20 MED ORDER — LIDOCAINE HCL 1 % IJ SOLN
5.0000 mL | INTRAMUSCULAR | Status: AC | PRN
Start: 2023-02-16 — End: 2023-02-16
  Administered 2023-02-16: 5 mL

## 2023-02-20 MED ORDER — METHYLPREDNISOLONE ACETATE 40 MG/ML IJ SUSP
40.0000 mg | INTRAMUSCULAR | Status: AC | PRN
Start: 2023-02-16 — End: 2023-02-16
  Administered 2023-02-16: 40 mg via INTRA_ARTICULAR
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
## 2023-02-23 ENCOUNTER — Telehealth: Payer: Self-pay

## 2023-02-23 NOTE — Telephone Encounter (Signed)
PA has been started online through [email or web address] for Durolane, bilateral knee. PA pending

## 2023-02-24 DIAGNOSIS — M6281 Muscle weakness (generalized): Secondary | ICD-10-CM | POA: Diagnosis not present

## 2023-02-24 DIAGNOSIS — R2689 Other abnormalities of gait and mobility: Secondary | ICD-10-CM | POA: Diagnosis not present

## 2023-02-24 DIAGNOSIS — M25569 Pain in unspecified knee: Secondary | ICD-10-CM | POA: Diagnosis not present

## 2023-03-12 DIAGNOSIS — Z0000 Encounter for general adult medical examination without abnormal findings: Secondary | ICD-10-CM | POA: Diagnosis not present

## 2023-03-12 DIAGNOSIS — Z87438 Personal history of other diseases of male genital organs: Secondary | ICD-10-CM | POA: Diagnosis not present

## 2023-03-12 DIAGNOSIS — Z125 Encounter for screening for malignant neoplasm of prostate: Secondary | ICD-10-CM | POA: Diagnosis not present

## 2023-03-12 DIAGNOSIS — I10 Essential (primary) hypertension: Secondary | ICD-10-CM | POA: Diagnosis not present

## 2023-03-12 DIAGNOSIS — J309 Allergic rhinitis, unspecified: Secondary | ICD-10-CM | POA: Diagnosis not present

## 2023-03-12 DIAGNOSIS — E7800 Pure hypercholesterolemia, unspecified: Secondary | ICD-10-CM | POA: Diagnosis not present

## 2023-03-12 DIAGNOSIS — G621 Alcoholic polyneuropathy: Secondary | ICD-10-CM | POA: Diagnosis not present

## 2023-03-12 DIAGNOSIS — K219 Gastro-esophageal reflux disease without esophagitis: Secondary | ICD-10-CM | POA: Diagnosis not present

## 2023-03-13 DIAGNOSIS — R2689 Other abnormalities of gait and mobility: Secondary | ICD-10-CM | POA: Diagnosis not present

## 2023-03-13 DIAGNOSIS — M6281 Muscle weakness (generalized): Secondary | ICD-10-CM | POA: Diagnosis not present

## 2023-03-13 DIAGNOSIS — M25569 Pain in unspecified knee: Secondary | ICD-10-CM | POA: Diagnosis not present

## 2023-05-01 ENCOUNTER — Other Ambulatory Visit: Payer: Self-pay

## 2023-05-01 DIAGNOSIS — M170 Bilateral primary osteoarthritis of knee: Secondary | ICD-10-CM

## 2023-05-13 ENCOUNTER — Ambulatory Visit: Payer: Medicare HMO | Admitting: Surgical

## 2023-05-13 ENCOUNTER — Encounter: Payer: Self-pay | Admitting: Surgical

## 2023-05-13 DIAGNOSIS — M170 Bilateral primary osteoarthritis of knee: Secondary | ICD-10-CM | POA: Diagnosis not present

## 2023-05-13 MED ORDER — SODIUM HYALURONATE 60 MG/3ML IX PRSY
60.0000 mg | PREFILLED_SYRINGE | INTRA_ARTICULAR | Status: AC | PRN
Start: 2023-05-13 — End: 2023-05-13
  Administered 2023-05-13: 60 mg via INTRA_ARTICULAR

## 2023-05-13 MED ORDER — LIDOCAINE HCL 1 % IJ SOLN
5.0000 mL | INTRAMUSCULAR | Status: AC | PRN
Start: 2023-05-13 — End: 2023-05-13
  Administered 2023-05-13: 5 mL

## 2023-05-13 NOTE — Progress Notes (Signed)
   Procedure Note  Patient: Luke Dalton             Date of Birth: Apr 21, 1951           MRN: [id number]             Visit Date: 05/13/2023  Procedures: Visit Diagnoses:  1. Bilateral primary osteoarthritis of knee     Large Joint Inj: bilateral knee on 05/13/2023 4:41 PM Indications: diagnostic evaluation, joint swelling and pain Details: 18 G 1.5 in needle, superolateral approach  Arthrogram: No  Medications (Right): 5 mL lidocaine 1 %; 60 mg Sodium Hyaluronate 60 MG/3ML Medications (Left): 5 mL lidocaine 1 %; 60 mg Sodium Hyaluronate 60 MG/3ML Outcome: tolerated well, no immediate complications  Cortisone injections helped for about 3 months Procedure, treatment alternatives, risks and benefits explained, specific risks discussed. Consent was given by the patient. Immediately prior to procedure a time out was called to verify the correct patient, procedure, equipment, support staff and site/side marked as required. Patient was prepped and draped in the usual sterile fashion.

## 2023-06-17 DIAGNOSIS — H524 Presbyopia: Secondary | ICD-10-CM | POA: Diagnosis not present

## 2023-09-10 DIAGNOSIS — I10 Essential (primary) hypertension: Secondary | ICD-10-CM | POA: Diagnosis not present

## 2023-09-10 DIAGNOSIS — E7800 Pure hypercholesterolemia, unspecified: Secondary | ICD-10-CM | POA: Diagnosis not present

## 2024-01-05 DIAGNOSIS — I10 Essential (primary) hypertension: Secondary | ICD-10-CM | POA: Diagnosis not present

## 2024-01-05 DIAGNOSIS — E7800 Pure hypercholesterolemia, unspecified: Secondary | ICD-10-CM | POA: Diagnosis not present

## 2024-01-07 ENCOUNTER — Telehealth: Payer: Self-pay | Admitting: Radiology

## 2024-01-07 NOTE — Telephone Encounter (Signed)
 I will submit today and it may require an authorization. I will let you know.  Thank you

## 2024-01-07 NOTE — Telephone Encounter (Signed)
 VOB submitted for Durolane, bilateral knee.  He should be approved I will let you know as soon as his benefits come back.

## 2024-01-07 NOTE — Telephone Encounter (Signed)
 Patient is on the schedule to see Herlene next Friday, with his comment for appointment stating bilateral knee gel injections.  It looks like he had these last in 05/2023.  Can you tell me if he will be ok to get the injections when he comes in?

## 2024-01-14 ENCOUNTER — Other Ambulatory Visit: Payer: Self-pay

## 2024-01-14 DIAGNOSIS — M170 Bilateral primary osteoarthritis of knee: Secondary | ICD-10-CM

## 2024-01-15 ENCOUNTER — Encounter: Payer: Self-pay | Admitting: Surgical

## 2024-01-15 ENCOUNTER — Ambulatory Visit: Admitting: Surgical

## 2024-01-15 DIAGNOSIS — M170 Bilateral primary osteoarthritis of knee: Secondary | ICD-10-CM

## 2024-01-15 MED ORDER — SODIUM HYALURONATE 60 MG/3ML IX PRSY
60.0000 mg | PREFILLED_SYRINGE | INTRA_ARTICULAR | Status: AC | PRN
Start: 2024-01-15 — End: 2024-01-15
  Administered 2024-01-15: 60 mg via INTRA_ARTICULAR

## 2024-01-15 MED ORDER — LIDOCAINE HCL 1 % IJ SOLN
5.0000 mL | INTRAMUSCULAR | Status: AC | PRN
  Administered 2024-01-15: 5 mL

## 2024-01-15 NOTE — Progress Notes (Signed)
   Procedure Note  Patient: Luke Dalton             Date of Birth: February 07, 1951           MRN: [id number]             Visit Date: 01/15/2024  Procedures: Visit Diagnoses:  1. Bilateral primary osteoarthritis of knee     Large Joint Inj: bilateral knee on 01/15/2024 4:14 PM Indications: diagnostic evaluation, joint swelling and pain Details: 18 G 1.5 in needle, superolateral approach  Arthrogram: No  Medications (Right): 5 mL lidocaine  1 %; 60 mg Sodium Hyaluronate 60 MG/3ML Medications (Left): 5 mL lidocaine  1 %; 60 mg Sodium Hyaluronate 60 MG/3ML Outcome: tolerated well, no immediate complications Procedure, treatment alternatives, risks and benefits explained, specific risks discussed. Consent was given by the patient. Immediately prior to procedure a time out was called to verify the correct patient, procedure, equipment, support staff and site/side marked as required. Patient was prepped and draped in the usual sterile fashion.

## 2024-02-16 ENCOUNTER — Ambulatory Visit: Admitting: Neurology

## 2024-02-16 VITALS — BP 164/71 | HR 68 | Ht 71.0 in | Wt 194.8 lb

## 2024-02-16 DIAGNOSIS — G621 Alcoholic polyneuropathy: Secondary | ICD-10-CM | POA: Diagnosis not present

## 2024-02-16 DIAGNOSIS — R292 Abnormal reflex: Secondary | ICD-10-CM

## 2024-02-16 DIAGNOSIS — M5417 Radiculopathy, lumbosacral region: Secondary | ICD-10-CM | POA: Diagnosis not present

## 2024-02-16 MED ORDER — DULOXETINE HCL 30 MG PO CPEP: 30.0000 mg | ORAL_CAPSULE | Freq: Every day | ORAL | 3 refills | Status: DC

## 2024-02-16 NOTE — Patient Instructions (Addendum)
 MRI lumbar spine  at [facility] [phone] Physical therapy for low back strengthening Start duloxetine  30mg  daily

## 2024-02-16 NOTE — Progress Notes (Signed)
 Follow-up Visit   Date: 02/16/2024    ATWOOD ADCOCK MRN: [id number] DOB: 1950/08/12    PAWAN KNECHTEL is a 73 y.o. right-handed Caucasian male with GERD, hypertension, hyperlipidemia, and BPH returning to the clinic for follow-up of painful neuropathy and bilateral leg pain.  The patient was accompanied to the clinic by self.  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  Exam shows brisk patella reflexes bilaterally with intact motor strength.  I explained that neuropathy would not cause proximal leg/thigh pain.   - MRI lumbar spine wo   - PT for low back strengthening.  He would like to go to [facility] in [place].  - Start duloxetine  30mg  daily.  He does not wish to take gabapentin   2.  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  Symptoms are annoying and more noticeable as the day progresses.  He has some difficulty with balance.  He walks unassisted, no falls. He denies weakness in the legs. No exacerbating or alleviating factors.  There has not been any significant progression over the years.   He drinks 2 glasses of wine nightly. He reports drinking more several years.  No history of diabetes or exposure to chemotherapy.   He had NCS/EMG performed at [facility] several years ago which confirmed axonal and demyelinating polyneuropathy.   UPDATE 02/16/2024:  He is here with new complaints of bilateral leg pain.   Three weeks ago, he developed shooting pain down the back of his thigh and lower legs.  Pain is better when he is moving and more active.  Pain is worse when he first  tries to stand up.  He denies low back pain or cramps.   He is taking motrin 800-1200mg  daily for leg pain, which provides some relief.   He continues to have numbness in the feet, which is unchanged.  He completed PT, which helped his balance. He has stopped drinking alcohol.   Medications:  Current Outpatient Medications on File Prior to Visit  Medication Sig Dispense Refill   Ascorbic Acid (VITAMIN C) 1000 MG tablet Take 1,000 mg by mouth daily.     cyanocobalamin  (VITAMIN B12) 500 MCG tablet Take 500 mcg by mouth daily.     famotidine (PEPCID) 40 MG tablet Take 40 mg by mouth daily.     losartan (COZAAR) 25 MG tablet Take 25 mg by mouth daily.     montelukast (SINGULAIR) 10 MG tablet Take 10 mg by mouth every morning.     Omega-3 Fatty Acids (FISH OIL PO) Take 1 capsule by mouth daily.     simvastatin (ZOCOR) 20 MG tablet Take 20 mg by mouth at bedtime.     simvastatin (ZOCOR) 40 MG tablet Take 40 mg by mouth every other day. (Patient taking differently: Take 20 mg by mouth daily.)     tamsulosin (FLOMAX) 0.4 MG CAPS capsule Take 0.4 mg by mouth daily.     triamcinolone (KENALOG) 0.147 MG/GM topical spray Apply topically 2 (two) times daily.     Vitamin D, Ergocalciferol, (DRISDOL) 50000 units CAPS capsule Take 50,000 Units by mouth every 14 (fourteen) days.     No current facility-administered  medications on file prior to visit.    Allergies:  Allergies  Allergen Reactions   Losartan     Other Reaction(s): tachycardia, hypotension at 50 mg dose    Vital Signs:  BP (!) 164/71   Pulse 68   Ht 5' 11 (1.803 m)   Wt 194 lb 12.8 oz (88.4 kg)   SpO2 97%   BMI 27.17 kg/m    Neurological Exam: MENTAL STATUS including orientation to time, place, person, recent and remote memory, attention span and concentration, language, and fund of knowledge is normal.  Speech is not dysarthric.  CRANIAL NERVES:  Normal conjugate, extra-ocular eye movements in all directions of gaze.  No  ptosis.  Face is symmetric.   MOTOR:  Motor strength is 5/5 in all extremities.  No atrophy, fasciculations or abnormal movements.  No pronator drift.  Tone is normal.    MSRs:                                           Right        Left brachioradialis 2+  2+  biceps 2+  2+  triceps 2+  2+  patellar 3+  3+  ankle jerk 0  0   SENSORY:  Reduced vibration at the ankles bilaterally.   Temperature intact in the lower legs.   COORDINATION/GAIT:  Normal finger-to- nose-finger.  Intact rapid alternating movements bilaterally.  Gait narrow based and stable.   Data: Labs 02/04/2023:  Vitamin B12 580, folate 13, vitamin B1 13, copper  58, SPEP with IFE no M protein     Thank you for allowing me to participate in patient's care.  If I can answer any additional questions, I would be pleased to do so.    Sincerely,     Afiya Ferrebee K. Tobie, DO

## 2024-02-26 ENCOUNTER — Ambulatory Visit
Admission: RE | Admit: 2024-02-26 | Discharge: 2024-02-26 | Disposition: A | Discharge status: Home / Self Care | Source: Ambulatory Visit | Attending: Neurology | Admitting: Neurology

## 2024-02-26 DIAGNOSIS — M48061 Spinal stenosis, lumbar region without neurogenic claudication: Secondary | ICD-10-CM | POA: Diagnosis not present

## 2024-02-26 DIAGNOSIS — M5127 Other intervertebral disc displacement, lumbosacral region: Secondary | ICD-10-CM | POA: Diagnosis not present

## 2024-02-26 DIAGNOSIS — M47817 Spondylosis without myelopathy or radiculopathy, lumbosacral region: Secondary | ICD-10-CM | POA: Diagnosis not present

## 2024-03-01 ENCOUNTER — Other Ambulatory Visit

## 2024-03-07 ENCOUNTER — Ambulatory Visit: Payer: Self-pay | Admitting: Neurology

## 2024-03-07 DIAGNOSIS — Z125 Encounter for screening for malignant neoplasm of prostate: Secondary | ICD-10-CM | POA: Diagnosis not present

## 2024-03-07 DIAGNOSIS — M48062 Spinal stenosis, lumbar region with neurogenic claudication: Secondary | ICD-10-CM

## 2024-03-08 ENCOUNTER — Telehealth: Payer: Self-pay | Admitting: Neurology

## 2024-03-08 DIAGNOSIS — M48061 Spinal stenosis, lumbar region without neurogenic claudication: Secondary | ICD-10-CM

## 2024-03-08 DIAGNOSIS — M5417 Radiculopathy, lumbosacral region: Secondary | ICD-10-CM

## 2024-03-08 DIAGNOSIS — M48062 Spinal stenosis, lumbar region with neurogenic claudication: Secondary | ICD-10-CM

## 2024-03-08 MED ORDER — DULOXETINE HCL 30 MG PO CPEP: 30.0000 mg | ORAL_CAPSULE | Freq: Two times a day (BID) | ORAL | 3 refills | Status: DC

## 2024-03-08 NOTE — Telephone Encounter (Signed)
 Patient contacted about MRI results. See result note.

## 2024-03-08 NOTE — Telephone Encounter (Signed)
 Pt. Called in this morning and he stated that he receive his MRI results. Pt would like a call back to discuss the results. Thanks

## 2024-03-09 DIAGNOSIS — M48062 Spinal stenosis, lumbar region with neurogenic claudication: Secondary | ICD-10-CM | POA: Diagnosis not present

## 2024-03-09 DIAGNOSIS — Z6827 Body mass index (BMI) 27.0-27.9, adult: Secondary | ICD-10-CM | POA: Diagnosis not present

## 2024-03-09 DIAGNOSIS — M47816 Spondylosis without myelopathy or radiculopathy, lumbar region: Secondary | ICD-10-CM | POA: Diagnosis not present

## 2024-03-14 ENCOUNTER — Telehealth: Payer: Self-pay | Admitting: Neurology

## 2024-03-14 NOTE — Telephone Encounter (Signed)
 Called patient and informed him of Dr. Anthony recommendations. Patient stated he will just continue tylenol and motrin as directed by Dr. Lanis until his injections. He will call if he changes his mind about the Pregabalin. Patient was advised per Dr. Tobie to take one tablet of Cymbalta  for one week and stop. Patient verbalized understanding of all explained and had no further questions or concerns.

## 2024-03-14 NOTE — Telephone Encounter (Signed)
 Pts has questions about decreasing or stopping Rx DULoxetine  (CYMBALTA ) 30 MG capsule

## 2024-03-14 NOTE — Telephone Encounter (Signed)
 OK to stop cymbalta , if it is not helping.  Did he discuss with pain management at [facility] about alternative medications to take?  We can try pregabalin, but this is similar to gabapentin, which he did not want to take.

## 2024-03-14 NOTE — Telephone Encounter (Signed)
 Called patient and he stated that he has been taking Cymbalta  30 mg twice a day for over a month and it doesn't seem to be working at all. Patient also reports that the Cymbalta  has been causing him constipation.   Patient also wanted to let Dr. Tobie know he starts Injections in back on 03/28/24 and is starting physical therapy on wednesday this week.   Patient aware I will contact him back.

## 2024-03-28 DIAGNOSIS — M48062 Spinal stenosis, lumbar region with neurogenic claudication: Secondary | ICD-10-CM | POA: Diagnosis not present

## 2024-03-31 DIAGNOSIS — M48062 Spinal stenosis, lumbar region with neurogenic claudication: Secondary | ICD-10-CM | POA: Diagnosis not present

## 2024-03-31 DIAGNOSIS — R202 Paresthesia of skin: Secondary | ICD-10-CM | POA: Diagnosis not present

## 2024-03-31 DIAGNOSIS — M79606 Pain in leg, unspecified: Secondary | ICD-10-CM | POA: Diagnosis not present

## 2024-03-31 DIAGNOSIS — M6281 Muscle weakness (generalized): Secondary | ICD-10-CM | POA: Diagnosis not present

## 2024-04-05 ENCOUNTER — Telehealth: Payer: Self-pay | Admitting: Neurology

## 2024-04-05 DIAGNOSIS — M48062 Spinal stenosis, lumbar region with neurogenic claudication: Secondary | ICD-10-CM | POA: Diagnosis not present

## 2024-04-05 DIAGNOSIS — M6281 Muscle weakness (generalized): Secondary | ICD-10-CM | POA: Diagnosis not present

## 2024-04-05 DIAGNOSIS — M79606 Pain in leg, unspecified: Secondary | ICD-10-CM | POA: Diagnosis not present

## 2024-04-05 DIAGNOSIS — R202 Paresthesia of skin: Secondary | ICD-10-CM | POA: Diagnosis not present

## 2024-04-05 NOTE — Telephone Encounter (Signed)
 He wants Dr. Tobie to know that he has decided to got back to the Cymbalta .

## 2024-04-07 DIAGNOSIS — M6281 Muscle weakness (generalized): Secondary | ICD-10-CM | POA: Diagnosis not present

## 2024-04-07 DIAGNOSIS — M79606 Pain in leg, unspecified: Secondary | ICD-10-CM | POA: Diagnosis not present

## 2024-04-07 DIAGNOSIS — M48062 Spinal stenosis, lumbar region with neurogenic claudication: Secondary | ICD-10-CM | POA: Diagnosis not present

## 2024-04-07 DIAGNOSIS — R202 Paresthesia of skin: Secondary | ICD-10-CM | POA: Diagnosis not present

## 2024-04-11 DIAGNOSIS — R202 Paresthesia of skin: Secondary | ICD-10-CM | POA: Diagnosis not present

## 2024-04-11 DIAGNOSIS — M79606 Pain in leg, unspecified: Secondary | ICD-10-CM | POA: Diagnosis not present

## 2024-04-11 DIAGNOSIS — M6281 Muscle weakness (generalized): Secondary | ICD-10-CM | POA: Diagnosis not present

## 2024-04-11 DIAGNOSIS — M48062 Spinal stenosis, lumbar region with neurogenic claudication: Secondary | ICD-10-CM | POA: Diagnosis not present

## 2024-04-14 DIAGNOSIS — M48062 Spinal stenosis, lumbar region with neurogenic claudication: Secondary | ICD-10-CM | POA: Diagnosis not present

## 2024-04-14 DIAGNOSIS — M79606 Pain in leg, unspecified: Secondary | ICD-10-CM | POA: Diagnosis not present

## 2024-04-14 DIAGNOSIS — M6281 Muscle weakness (generalized): Secondary | ICD-10-CM | POA: Diagnosis not present

## 2024-04-14 DIAGNOSIS — R202 Paresthesia of skin: Secondary | ICD-10-CM | POA: Diagnosis not present

## 2024-04-19 DIAGNOSIS — M48062 Spinal stenosis, lumbar region with neurogenic claudication: Secondary | ICD-10-CM | POA: Diagnosis not present

## 2024-04-19 DIAGNOSIS — M79606 Pain in leg, unspecified: Secondary | ICD-10-CM | POA: Diagnosis not present

## 2024-04-19 DIAGNOSIS — R202 Paresthesia of skin: Secondary | ICD-10-CM | POA: Diagnosis not present

## 2024-04-19 DIAGNOSIS — M6281 Muscle weakness (generalized): Secondary | ICD-10-CM | POA: Diagnosis not present

## 2024-04-21 DIAGNOSIS — M48062 Spinal stenosis, lumbar region with neurogenic claudication: Secondary | ICD-10-CM | POA: Diagnosis not present

## 2024-04-21 DIAGNOSIS — R202 Paresthesia of skin: Secondary | ICD-10-CM | POA: Diagnosis not present

## 2024-04-21 DIAGNOSIS — M6281 Muscle weakness (generalized): Secondary | ICD-10-CM | POA: Diagnosis not present

## 2024-04-21 DIAGNOSIS — M79606 Pain in leg, unspecified: Secondary | ICD-10-CM | POA: Diagnosis not present

## 2024-04-25 DIAGNOSIS — M48062 Spinal stenosis, lumbar region with neurogenic claudication: Secondary | ICD-10-CM | POA: Diagnosis not present

## 2024-04-25 DIAGNOSIS — M6281 Muscle weakness (generalized): Secondary | ICD-10-CM | POA: Diagnosis not present

## 2024-04-25 DIAGNOSIS — M79606 Pain in leg, unspecified: Secondary | ICD-10-CM | POA: Diagnosis not present

## 2024-04-25 DIAGNOSIS — R202 Paresthesia of skin: Secondary | ICD-10-CM | POA: Diagnosis not present

## 2024-04-27 DIAGNOSIS — M48062 Spinal stenosis, lumbar region with neurogenic claudication: Secondary | ICD-10-CM | POA: Diagnosis not present

## 2024-04-27 DIAGNOSIS — Z6827 Body mass index (BMI) 27.0-27.9, adult: Secondary | ICD-10-CM | POA: Diagnosis not present

## 2024-04-28 DIAGNOSIS — M6281 Muscle weakness (generalized): Secondary | ICD-10-CM | POA: Diagnosis not present

## 2024-04-28 DIAGNOSIS — M48062 Spinal stenosis, lumbar region with neurogenic claudication: Secondary | ICD-10-CM | POA: Diagnosis not present

## 2024-04-28 DIAGNOSIS — M79606 Pain in leg, unspecified: Secondary | ICD-10-CM | POA: Diagnosis not present

## 2024-04-28 DIAGNOSIS — R202 Paresthesia of skin: Secondary | ICD-10-CM | POA: Diagnosis not present

## 2024-05-02 ENCOUNTER — Encounter: Payer: Self-pay | Admitting: Radiology

## 2024-05-02 DIAGNOSIS — M79606 Pain in leg, unspecified: Secondary | ICD-10-CM | POA: Diagnosis not present

## 2024-05-02 DIAGNOSIS — M6281 Muscle weakness (generalized): Secondary | ICD-10-CM | POA: Diagnosis not present

## 2024-05-02 DIAGNOSIS — M48062 Spinal stenosis, lumbar region with neurogenic claudication: Secondary | ICD-10-CM | POA: Diagnosis not present

## 2024-05-02 DIAGNOSIS — R202 Paresthesia of skin: Secondary | ICD-10-CM | POA: Diagnosis not present

## 2024-05-05 DIAGNOSIS — R202 Paresthesia of skin: Secondary | ICD-10-CM | POA: Diagnosis not present

## 2024-05-05 DIAGNOSIS — M48062 Spinal stenosis, lumbar region with neurogenic claudication: Secondary | ICD-10-CM | POA: Diagnosis not present

## 2024-05-05 DIAGNOSIS — M79606 Pain in leg, unspecified: Secondary | ICD-10-CM | POA: Diagnosis not present

## 2024-05-05 DIAGNOSIS — M6281 Muscle weakness (generalized): Secondary | ICD-10-CM | POA: Diagnosis not present

## 2024-05-09 DIAGNOSIS — H43811 Vitreous degeneration, right eye: Secondary | ICD-10-CM | POA: Diagnosis not present

## 2024-05-09 DIAGNOSIS — H43812 Vitreous degeneration, left eye: Secondary | ICD-10-CM | POA: Diagnosis not present

## 2024-05-09 DIAGNOSIS — H40053 Ocular hypertension, bilateral: Secondary | ICD-10-CM | POA: Diagnosis not present

## 2024-05-11 DIAGNOSIS — M6281 Muscle weakness (generalized): Secondary | ICD-10-CM | POA: Diagnosis not present

## 2024-05-11 DIAGNOSIS — M79606 Pain in leg, unspecified: Secondary | ICD-10-CM | POA: Diagnosis not present

## 2024-05-11 DIAGNOSIS — M48062 Spinal stenosis, lumbar region with neurogenic claudication: Secondary | ICD-10-CM | POA: Diagnosis not present

## 2024-05-11 DIAGNOSIS — R202 Paresthesia of skin: Secondary | ICD-10-CM | POA: Diagnosis not present

## 2024-05-17 ENCOUNTER — Ambulatory Visit: Admitting: Neurology

## 2024-05-17 DIAGNOSIS — M6281 Muscle weakness (generalized): Secondary | ICD-10-CM | POA: Diagnosis not present

## 2024-05-17 DIAGNOSIS — M48062 Spinal stenosis, lumbar region with neurogenic claudication: Secondary | ICD-10-CM | POA: Diagnosis not present

## 2024-05-17 DIAGNOSIS — R202 Paresthesia of skin: Secondary | ICD-10-CM | POA: Diagnosis not present

## 2024-05-17 DIAGNOSIS — M79606 Pain in leg, unspecified: Secondary | ICD-10-CM | POA: Diagnosis not present

## 2024-05-19 DIAGNOSIS — R202 Paresthesia of skin: Secondary | ICD-10-CM | POA: Diagnosis not present

## 2024-05-19 DIAGNOSIS — M6281 Muscle weakness (generalized): Secondary | ICD-10-CM | POA: Diagnosis not present

## 2024-05-19 DIAGNOSIS — M48062 Spinal stenosis, lumbar region with neurogenic claudication: Secondary | ICD-10-CM | POA: Diagnosis not present

## 2024-05-19 DIAGNOSIS — M79606 Pain in leg, unspecified: Secondary | ICD-10-CM | POA: Diagnosis not present

## 2024-05-23 DIAGNOSIS — R202 Paresthesia of skin: Secondary | ICD-10-CM | POA: Diagnosis not present

## 2024-05-23 DIAGNOSIS — M48062 Spinal stenosis, lumbar region with neurogenic claudication: Secondary | ICD-10-CM | POA: Diagnosis not present

## 2024-05-23 DIAGNOSIS — M79606 Pain in leg, unspecified: Secondary | ICD-10-CM | POA: Diagnosis not present

## 2024-05-23 DIAGNOSIS — M6281 Muscle weakness (generalized): Secondary | ICD-10-CM | POA: Diagnosis not present

## 2024-05-30 DIAGNOSIS — M79606 Pain in leg, unspecified: Secondary | ICD-10-CM | POA: Diagnosis not present

## 2024-05-30 DIAGNOSIS — M48062 Spinal stenosis, lumbar region with neurogenic claudication: Secondary | ICD-10-CM | POA: Diagnosis not present

## 2024-05-30 DIAGNOSIS — M6281 Muscle weakness (generalized): Secondary | ICD-10-CM | POA: Diagnosis not present

## 2024-05-30 DIAGNOSIS — R202 Paresthesia of skin: Secondary | ICD-10-CM | POA: Diagnosis not present

## 2024-06-02 DIAGNOSIS — R202 Paresthesia of skin: Secondary | ICD-10-CM | POA: Diagnosis not present

## 2024-06-02 DIAGNOSIS — M79606 Pain in leg, unspecified: Secondary | ICD-10-CM | POA: Diagnosis not present

## 2024-06-02 DIAGNOSIS — M48062 Spinal stenosis, lumbar region with neurogenic claudication: Secondary | ICD-10-CM | POA: Diagnosis not present

## 2024-06-02 DIAGNOSIS — M6281 Muscle weakness (generalized): Secondary | ICD-10-CM | POA: Diagnosis not present

## 2024-06-08 DIAGNOSIS — H2513 Age-related nuclear cataract, bilateral: Secondary | ICD-10-CM | POA: Diagnosis not present

## 2024-06-08 DIAGNOSIS — H43811 Vitreous degeneration, right eye: Secondary | ICD-10-CM | POA: Diagnosis not present

## 2024-06-08 DIAGNOSIS — H43812 Vitreous degeneration, left eye: Secondary | ICD-10-CM | POA: Diagnosis not present

## 2024-06-08 DIAGNOSIS — H40053 Ocular hypertension, bilateral: Secondary | ICD-10-CM | POA: Diagnosis not present

## 2024-06-10 DIAGNOSIS — M79606 Pain in leg, unspecified: Secondary | ICD-10-CM | POA: Diagnosis not present

## 2024-06-10 DIAGNOSIS — R202 Paresthesia of skin: Secondary | ICD-10-CM | POA: Diagnosis not present

## 2024-06-10 DIAGNOSIS — M48062 Spinal stenosis, lumbar region with neurogenic claudication: Secondary | ICD-10-CM | POA: Diagnosis not present

## 2024-06-10 DIAGNOSIS — M6281 Muscle weakness (generalized): Secondary | ICD-10-CM | POA: Diagnosis not present

## 2024-06-13 DIAGNOSIS — R202 Paresthesia of skin: Secondary | ICD-10-CM | POA: Diagnosis not present

## 2024-06-13 DIAGNOSIS — M48062 Spinal stenosis, lumbar region with neurogenic claudication: Secondary | ICD-10-CM | POA: Diagnosis not present

## 2024-06-13 DIAGNOSIS — M6281 Muscle weakness (generalized): Secondary | ICD-10-CM | POA: Diagnosis not present

## 2024-06-13 DIAGNOSIS — M79606 Pain in leg, unspecified: Secondary | ICD-10-CM | POA: Diagnosis not present

## 2024-06-16 DIAGNOSIS — M79606 Pain in leg, unspecified: Secondary | ICD-10-CM | POA: Diagnosis not present

## 2024-06-16 DIAGNOSIS — R202 Paresthesia of skin: Secondary | ICD-10-CM | POA: Diagnosis not present

## 2024-06-16 DIAGNOSIS — M48062 Spinal stenosis, lumbar region with neurogenic claudication: Secondary | ICD-10-CM | POA: Diagnosis not present

## 2024-06-16 DIAGNOSIS — M6281 Muscle weakness (generalized): Secondary | ICD-10-CM | POA: Diagnosis not present

## 2024-06-20 NOTE — Progress Notes (Addendum)
 Luke Dalton                                          MRN: [id number]   07/12/2024   The [facility] Quality Team Specialist reviewed this patient medical record for the purposes of chart review for care gap closure. The following were reviewed: chart review for care gap closure-controlling blood pressure.    [facility] Quality Team

## 2024-07-04 ENCOUNTER — Other Ambulatory Visit: Payer: Self-pay | Admitting: Neurology

## 2024-07-05 ENCOUNTER — Telehealth: Payer: Self-pay | Admitting: Neurology

## 2024-07-05 NOTE — Telephone Encounter (Signed)
 Pt would like refill, he now has appt date. Could not take Jan. dates

## 2024-07-06 MED ORDER — DULOXETINE HCL 30 MG PO CPEP: 30.0000 mg | ORAL_CAPSULE | Freq: Two times a day (BID) | ORAL | 3 refills | Status: AC

## 2024-07-06 NOTE — Telephone Encounter (Signed)
 Called patient and informed him  Rx sent, he may request refills from PCP, if that is easier.  He will need to be seen in the office for any additional refills.   Patient verbalized understanding and had no further questions or concerns.

## 2024-07-06 NOTE — Telephone Encounter (Signed)
 Rx sent, he may request refills from PCP, if that is easier.  He will need to be seen in the office for any additional refills.

## 2024-07-11 ENCOUNTER — Encounter (HOSPITAL_COMMUNITY): Payer: Self-pay

## 2024-07-11 ENCOUNTER — Other Ambulatory Visit: Payer: Self-pay

## 2024-07-11 ENCOUNTER — Emergency Department (HOSPITAL_COMMUNITY)

## 2024-07-11 ENCOUNTER — Inpatient Hospital Stay (HOSPITAL_COMMUNITY)
Admission: EM | Admit: 2024-07-11 | Discharge: 2024-07-19 | DRG: 372 | Disposition: A | Discharge status: Home / Self Care | Attending: Internal Medicine | Admitting: Internal Medicine

## 2024-07-11 DIAGNOSIS — Z01810 Encounter for preprocedural cardiovascular examination: Secondary | ICD-10-CM

## 2024-07-11 DIAGNOSIS — D62 Acute posthemorrhagic anemia: Secondary | ICD-10-CM | POA: Diagnosis not present

## 2024-07-11 DIAGNOSIS — G621 Alcoholic polyneuropathy: Secondary | ICD-10-CM | POA: Diagnosis present

## 2024-07-11 DIAGNOSIS — E872 Acidosis, unspecified: Secondary | ICD-10-CM | POA: Diagnosis present

## 2024-07-11 DIAGNOSIS — E538 Deficiency of other specified B group vitamins: Secondary | ICD-10-CM | POA: Diagnosis present

## 2024-07-11 DIAGNOSIS — K56699 Other intestinal obstruction unspecified as to partial versus complete obstruction: Secondary | ICD-10-CM | POA: Diagnosis present

## 2024-07-11 DIAGNOSIS — M170 Bilateral primary osteoarthritis of knee: Secondary | ICD-10-CM | POA: Diagnosis present

## 2024-07-11 DIAGNOSIS — E860 Dehydration: Secondary | ICD-10-CM | POA: Diagnosis present

## 2024-07-11 DIAGNOSIS — W010XXA Fall on same level from slipping, tripping and stumbling without subsequent striking against object, initial encounter: Secondary | ICD-10-CM | POA: Diagnosis present

## 2024-07-11 DIAGNOSIS — M48061 Spinal stenosis, lumbar region without neurogenic claudication: Secondary | ICD-10-CM | POA: Diagnosis present

## 2024-07-11 DIAGNOSIS — I10 Essential (primary) hypertension: Secondary | ICD-10-CM | POA: Diagnosis present

## 2024-07-11 DIAGNOSIS — N179 Acute kidney failure, unspecified: Secondary | ICD-10-CM | POA: Diagnosis present

## 2024-07-11 DIAGNOSIS — Z888 Allergy status to other drugs, medicaments and biological substances status: Secondary | ICD-10-CM

## 2024-07-11 DIAGNOSIS — I4892 Unspecified atrial flutter: Secondary | ICD-10-CM | POA: Diagnosis present

## 2024-07-11 DIAGNOSIS — B9620 Unspecified Escherichia coli [E. coli] as the cause of diseases classified elsewhere: Secondary | ICD-10-CM | POA: Diagnosis present

## 2024-07-11 DIAGNOSIS — R7401 Elevation of levels of liver transaminase levels: Secondary | ICD-10-CM | POA: Diagnosis present

## 2024-07-11 DIAGNOSIS — Z860101 Personal history of adenomatous and serrated colon polyps: Secondary | ICD-10-CM

## 2024-07-11 DIAGNOSIS — M47817 Spondylosis without myelopathy or radiculopathy, lumbosacral region: Secondary | ICD-10-CM | POA: Diagnosis present

## 2024-07-11 DIAGNOSIS — Z87891 Personal history of nicotine dependence: Secondary | ICD-10-CM

## 2024-07-11 DIAGNOSIS — R270 Ataxia, unspecified: Secondary | ICD-10-CM | POA: Diagnosis present

## 2024-07-11 DIAGNOSIS — R7881 Bacteremia: Secondary | ICD-10-CM | POA: Diagnosis present

## 2024-07-11 DIAGNOSIS — G8929 Other chronic pain: Secondary | ICD-10-CM | POA: Diagnosis present

## 2024-07-11 DIAGNOSIS — I480 Paroxysmal atrial fibrillation: Secondary | ICD-10-CM | POA: Diagnosis present

## 2024-07-11 DIAGNOSIS — Z9089 Acquired absence of other organs: Secondary | ICD-10-CM

## 2024-07-11 DIAGNOSIS — M4727 Other spondylosis with radiculopathy, lumbosacral region: Secondary | ICD-10-CM | POA: Diagnosis present

## 2024-07-11 DIAGNOSIS — I700 Atherosclerosis of aorta: Secondary | ICD-10-CM | POA: Diagnosis present

## 2024-07-11 DIAGNOSIS — Z5181 Encounter for therapeutic drug level monitoring: Secondary | ICD-10-CM | POA: Diagnosis not present

## 2024-07-11 DIAGNOSIS — B966 Bacteroides fragilis [B. fragilis] as the cause of diseases classified elsewhere: Secondary | ICD-10-CM | POA: Diagnosis present

## 2024-07-11 DIAGNOSIS — K5730 Diverticulosis of large intestine without perforation or abscess without bleeding: Secondary | ICD-10-CM | POA: Diagnosis present

## 2024-07-11 DIAGNOSIS — Z79899 Other long term (current) drug therapy: Secondary | ICD-10-CM

## 2024-07-11 DIAGNOSIS — K3533 Acute appendicitis with perforation and localized peritonitis, with abscess: Principal | ICD-10-CM | POA: Diagnosis present

## 2024-07-11 DIAGNOSIS — K3532 Acute appendicitis with perforation and localized peritonitis, without abscess: Secondary | ICD-10-CM | POA: Diagnosis present

## 2024-07-11 DIAGNOSIS — I4819 Other persistent atrial fibrillation: Secondary | ICD-10-CM | POA: Diagnosis not present

## 2024-07-11 DIAGNOSIS — I440 Atrioventricular block, first degree: Secondary | ICD-10-CM | POA: Diagnosis present

## 2024-07-11 DIAGNOSIS — E7800 Pure hypercholesterolemia, unspecified: Secondary | ICD-10-CM | POA: Diagnosis present

## 2024-07-11 DIAGNOSIS — N400 Enlarged prostate without lower urinary tract symptoms: Secondary | ICD-10-CM | POA: Diagnosis present

## 2024-07-11 DIAGNOSIS — E876 Hypokalemia: Secondary | ICD-10-CM | POA: Diagnosis not present

## 2024-07-11 DIAGNOSIS — K921 Melena: Secondary | ICD-10-CM | POA: Diagnosis not present

## 2024-07-11 DIAGNOSIS — S0083XA Contusion of other part of head, initial encounter: Secondary | ICD-10-CM | POA: Diagnosis present

## 2024-07-11 DIAGNOSIS — I493 Ventricular premature depolarization: Secondary | ICD-10-CM | POA: Diagnosis present

## 2024-07-11 DIAGNOSIS — Z7901 Long term (current) use of anticoagulants: Secondary | ICD-10-CM | POA: Diagnosis not present

## 2024-07-11 DIAGNOSIS — Z9889 Other specified postprocedural states: Secondary | ICD-10-CM

## 2024-07-11 DIAGNOSIS — R9431 Abnormal electrocardiogram [ECG] [EKG]: Secondary | ICD-10-CM | POA: Diagnosis not present

## 2024-07-11 DIAGNOSIS — K219 Gastro-esophageal reflux disease without esophagitis: Secondary | ICD-10-CM | POA: Diagnosis present

## 2024-07-11 LAB — URINALYSIS, ROUTINE W REFLEX MICROSCOPIC
Bilirubin Urine: NEGATIVE
Glucose, UA: NEGATIVE mg/dL
Hgb urine dipstick: NEGATIVE
Ketones, ur: NEGATIVE mg/dL
Leukocytes,Ua: NEGATIVE
Nitrite: NEGATIVE
Protein, ur: 100 mg/dL — AB
Specific Gravity, Urine: >1.046 — ABNORMAL HIGH (ref 1.005–1.030)
pH: 5 (ref 5.0–8.0)

## 2024-07-11 LAB — COMPREHENSIVE METABOLIC PANEL WITH GFR
ALT: 128 U/L — ABNORMAL HIGH (ref 0–44)
AST: 102 U/L — ABNORMAL HIGH (ref 15–41)
Albumin: 3.8 g/dL (ref 3.5–5.0)
Alkaline Phosphatase: 102 U/L (ref 38–126)
Anion gap: 18 — ABNORMAL HIGH (ref 5–15)
BUN: 38 mg/dL — ABNORMAL HIGH (ref 8–23)
CO2: 21 mmol/L — ABNORMAL LOW (ref 22–32)
Calcium: 10.4 mg/dL — ABNORMAL HIGH (ref 8.9–10.3)
Chloride: 98 mmol/L (ref 98–111)
Creatinine, Ser: 1.41 mg/dL — ABNORMAL HIGH (ref 0.61–1.24)
GFR, Estimated: 52 mL/min — ABNORMAL LOW
Glucose, Bld: 152 mg/dL — ABNORMAL HIGH (ref 70–99)
Potassium: 3.5 mmol/L (ref 3.5–5.1)
Sodium: 137 mmol/L (ref 135–145)
Total Bilirubin: 1.3 mg/dL — ABNORMAL HIGH (ref 0.0–1.2)
Total Protein: 7.8 g/dL (ref 6.5–8.1)

## 2024-07-11 LAB — CBC WITH DIFFERENTIAL/PLATELET
Abs Immature Granulocytes: 0.19 K/uL — ABNORMAL HIGH (ref 0.00–0.07)
Basophils Absolute: 0.1 K/uL (ref 0.0–0.1)
Basophils Relative: 0 %
Eosinophils Absolute: 0.1 K/uL (ref 0.0–0.5)
Eosinophils Relative: 0 %
HCT: 49.2 % (ref 39.0–52.0)
Hemoglobin: 17 g/dL (ref 13.0–17.0)
Immature Granulocytes: 1 %
Lymphocytes Relative: 4 %
Lymphs Abs: 0.8 K/uL (ref 0.7–4.0)
MCH: 32.6 pg (ref 26.0–34.0)
MCHC: 34.6 g/dL (ref 30.0–36.0)
MCV: 94.4 fL (ref 80.0–100.0)
Monocytes Absolute: 1.3 K/uL — ABNORMAL HIGH (ref 0.1–1.0)
Monocytes Relative: 7 %
Neutro Abs: 17.7 K/uL — ABNORMAL HIGH (ref 1.7–7.7)
Neutrophils Relative %: 88 %
Platelets: 344 K/uL (ref 150–400)
RBC: 5.21 MIL/uL (ref 4.22–5.81)
RDW: 12.5 % (ref 11.5–15.5)
WBC: 20.2 K/uL — ABNORMAL HIGH (ref 4.0–10.5)
nRBC: 0 % (ref 0.0–0.2)

## 2024-07-11 LAB — LIPASE, BLOOD: Lipase: 17 U/L (ref 11–51)

## 2024-07-11 MED ORDER — MONTELUKAST SODIUM 10 MG PO TABS
10.0000 mg | ORAL_TABLET | ORAL | Status: DC
  Administered 2024-07-13 – 2024-07-19 (×7): 10 mg via ORAL
  Filled 2024-07-11 (×8): qty 1

## 2024-07-11 MED ORDER — ONDANSETRON 8 MG PO TBDP
8.0000 mg | ORAL_TABLET | Freq: Once | ORAL | Status: AC
  Administered 2024-07-11: 8 mg via ORAL
  Filled 2024-07-11: qty 1

## 2024-07-11 MED ORDER — ACETAMINOPHEN 325 MG PO TABS
650.0000 mg | ORAL_TABLET | Freq: Four times a day (QID) | ORAL | Status: DC | PRN
  Administered 2024-07-19: 650 mg via ORAL
  Filled 2024-07-11: qty 2

## 2024-07-11 MED ORDER — SIMVASTATIN 20 MG PO TABS: 20.0000 mg | ORAL_TABLET | Freq: Every day | ORAL | Status: DC

## 2024-07-11 MED ORDER — ACETAMINOPHEN 650 MG RE SUPP: 650.0000 mg | Freq: Four times a day (QID) | RECTAL | Status: DC | PRN

## 2024-07-11 MED ORDER — HYDROCODONE-ACETAMINOPHEN 5-325 MG PO TABS
1.0000 | ORAL_TABLET | Freq: Once | ORAL | Status: AC
  Administered 2024-07-11: 1 via ORAL
  Filled 2024-07-11: qty 1

## 2024-07-11 MED ORDER — PIPERACILLIN-TAZOBACTAM 3.375 G IVPB
3.3750 g | Freq: Three times a day (TID) | INTRAVENOUS | Status: DC
  Administered 2024-07-12 – 2024-07-15 (×11): 3.375 g via INTRAVENOUS
  Filled 2024-07-11 (×11): qty 50

## 2024-07-11 MED ORDER — DULOXETINE HCL 30 MG PO CPEP
30.0000 mg | ORAL_CAPSULE | Freq: Two times a day (BID) | ORAL | Status: DC
  Administered 2024-07-11 – 2024-07-19 (×15): 30 mg via ORAL
  Filled 2024-07-11 (×16): qty 1

## 2024-07-11 MED ORDER — SODIUM CHLORIDE 0.9% FLUSH
3.0000 mL | Freq: Two times a day (BID) | INTRAVENOUS | Status: DC
  Administered 2024-07-11 – 2024-07-19 (×16): 3 mL via INTRAVENOUS

## 2024-07-11 MED ORDER — SODIUM CHLORIDE 0.9 % IV BOLUS
1000.0000 mL | Freq: Once | INTRAVENOUS | Status: AC
  Administered 2024-07-11: 1000 mL via INTRAVENOUS

## 2024-07-11 MED ORDER — HEPARIN BOLUS VIA INFUSION
4000.0000 [IU] | Freq: Once | INTRAVENOUS | Status: AC
  Administered 2024-07-11: 4000 [IU] via INTRAVENOUS
  Filled 2024-07-11: qty 4000

## 2024-07-11 MED ORDER — PIPERACILLIN-TAZOBACTAM 3.375 G IVPB: 3.3750 g | Freq: Three times a day (TID) | INTRAVENOUS | Status: DC

## 2024-07-11 MED ORDER — IOHEXOL 300 MG/ML  SOLN
100.0000 mL | Freq: Once | INTRAMUSCULAR | Status: AC | PRN
  Administered 2024-07-11: 100 mL via INTRAVENOUS

## 2024-07-11 MED ORDER — ONDANSETRON HCL 4 MG/2ML IJ SOLN: 4.0000 mg | Freq: Four times a day (QID) | INTRAMUSCULAR | Status: DC | PRN

## 2024-07-11 MED ORDER — MORPHINE SULFATE (PF) 2 MG/ML IV SOLN
2.0000 mg | INTRAVENOUS | Status: DC | PRN
  Administered 2024-07-18: 2 mg via INTRAVENOUS
  Filled 2024-07-11: qty 1

## 2024-07-11 MED ORDER — TAMSULOSIN HCL 0.4 MG PO CAPS
0.4000 mg | ORAL_CAPSULE | Freq: Every day | ORAL | Status: DC
  Administered 2024-07-11 – 2024-07-19 (×9): 0.4 mg via ORAL
  Filled 2024-07-11 (×9): qty 1

## 2024-07-11 MED ORDER — PIPERACILLIN-TAZOBACTAM 3.375 G IVPB 30 MIN
3.3750 g | Freq: Once | INTRAVENOUS | Status: AC
  Administered 2024-07-11: 3.375 g via INTRAVENOUS
  Filled 2024-07-11: qty 50

## 2024-07-11 MED ORDER — HEPARIN (PORCINE) 25000 UT/250ML-% IV SOLN
1200.0000 [IU]/h | INTRAVENOUS | Status: DC
  Administered 2024-07-11: 1200 [IU]/h via INTRAVENOUS
  Filled 2024-07-11: qty 250

## 2024-07-11 MED ORDER — POLYETHYLENE GLYCOL 3350 17 G PO PACK: 17.0000 g | PACK | Freq: Every day | ORAL | Status: DC | PRN

## 2024-07-11 MED ORDER — LACTATED RINGERS IV SOLN: INTRAVENOUS | Status: DC

## 2024-07-11 MED ORDER — ONDANSETRON HCL 4 MG PO TABS: 4.0000 mg | ORAL_TABLET | Freq: Four times a day (QID) | ORAL | Status: DC | PRN

## 2024-07-11 NOTE — Progress Notes (Signed)
 Patient ID: Luke Dalton, male   DOB: 09-09-1950, 74 y.o.   MRN: [id number]  Brief surgery note  Patient with new onset atrial fibrillation and four days of abdominal pain.  Now with findings consistent with perforated appendicitis with at least two large pelvic abscesses.  Afebrile, irregular tachycardia, normotensive.  Recommendations:  Hospitalists admission to address atrial fibrillation/ Cardiology consult as indicated.  Interventional radiologist consult to place percutaneous drains tomorrow morning.  Formal consult to follow in AM.    Donnice POUR. Belinda, MD, [facility]  General Surgery   07/11/2024 6:33 PM

## 2024-07-11 NOTE — ED Notes (Signed)
 Patient had a fall two days ago and has abrasions on face and skin tear on left arm.  He states the abdominal pain is not related to that.  Denies any loss of consciousness and takes no blood thinners.  Fall bracelet placed on patient, urine specimen obtained.  Patient placed on monitoring equipment.  Family member is currently at bedside.

## 2024-07-11 NOTE — ED Notes (Signed)
 Patient denies pain at present time, states feels much better.

## 2024-07-11 NOTE — Progress Notes (Signed)
 ED Pharmacy Antibiotic Sign Off An antibiotic consult was received from an ED provider for zosyn   per pharmacy dosing for perforated appendicitis. A chart review was completed to assess appropriateness.   The following one time order(s) were placed:  - zosyn  3.375 gm IV x1 over 30 min  Further antibiotic and/or antibiotic pharmacy consults should be ordered by the admitting provider if indicated.   Thank you for allowing pharmacy to be a part of this patient's care.   Osie Iantha SQUIBB, [facility]  Clinical Pharmacist 07/11/2024 6:12 PM

## 2024-07-11 NOTE — ED Notes (Signed)
 MD aware patient in a-fib noted to be new onset.  Family member at bedside.  Patient denies any shortness of breath and does c/o abdominal pain.  CT has been completed, results pending

## 2024-07-11 NOTE — ED Triage Notes (Addendum)
 Patient said he thinks he has appendicitis. No vomiting. Has no appetite. Has diarrhea. Has right lower quadrant severe pain.

## 2024-07-11 NOTE — ED Notes (Signed)
 Noted WBC 20.2 MD aware

## 2024-07-11 NOTE — Progress Notes (Signed)
 PHARMACY - ANTICOAGULATION CONSULT NOTE  Pharmacy Consult for heparin   Indication: atrial fibrillation  Allergies[1]  Patient Measurements: Height: 5' 10 (177.8 cm) Weight: 84.6 kg (186 lb 6.4 oz) IBW/kg (Calculated) : 73 HEPARIN  DW (KG): 79.8  Vital Signs: Temp: 98 F (36.7 C) (01/12 1857) Temp Source: Oral (01/12 1857) BP: 132/83 (01/12 1800) Pulse Rate: 100 (01/12 1800)  Labs: Recent Labs    07/11/24 1521  HGB 17.0  HCT 49.2  PLT 344  CREATININE 1.41*    Estimated Creatinine Clearance: 47.5 mL/min (A) (by C-G formula based on SCr of 1.41 mg/dL (H)).   Medical History: Past Medical History:  Diagnosis Date   Arthritis    knees and hip   B12 deficiency    Elevated cholesterol    Environmental allergies    GERD (gastroesophageal reflux disease)    hx of none recent   Hypertension    Vitamin D deficiency     Assessment: Patient is a [name] who presented to the ED on 07/11/24 with c/o abdominal pain.  He was found to be in afib and abdominal CT showed perforated appendicitis with multiple pelvic abscesses. CCS recom.  perc drains for abscess.  Pharmacy has been consulted to dose heparin  for afib.  - Per [place] msg with Dr. Amaryllis Dare, the plan is for perc drain to be done by IR on 07/12/24.  She would like pharmacy to start heparin  tonight (07/11/24), turn drip off on 07/12/24 at 5 AM  in anticipation for drain placement, and resume heparin  drip s/p procedure.   Goal of Therapy:  Heparin  level 0.3-0.7 units/ml Monitor platelets by anticoagulation protocol: Yes   Plan:  - heparin  4000 units IV x1 bolus, then 1200 units/hr. Turn heparin  drip off at 5 AM on 07/12/24 - IR team, please advise when we can resume heparin  drip back after procedure.   Osie Critchley P 07/11/2024,7:38 PM      [1]  Allergies Allergen Reactions   Losartan     Other Reaction(s): tachycardia, hypotension at 50 mg dose

## 2024-07-11 NOTE — ED Provider Notes (Signed)
 " [facility] AT [facility] Provider Note   CSN: [id number] Arrival date & time: 07/11/24  1449     Patient presents with: Abdominal Pain   Luke Dalton is a 74 y.o. male who presented emergency department with complaint of abdominal pain.  Patient ports been ongoing for several days.  It was intermittently right flank pain and sometimes right lower abdominal pain, however today he went to see the neurosurgeon in the office for back pain issues, and had his abdomen palpated and was concerned for appendicitis.  The patient reports has had a very poor appetite or no appetite for several days.  He had a bowel movement yesterday which was very small, says are not anything in me.  Denies history of abdominal surgeries.  He does report that he fell 2 days ago when he tripped in the evening, which witnessed by his wife.  He did lightly strike his head without loss of consciousness and also scraped his left elbow.  He is not on blood thinners.  He denies any chest pain or pressure.  Denies any palpitations.  Denies known history of arrhythmias.  Of note, the patient also reports that he had epidural spinal injections performed 1 week ago for chronic low back pain.  {Add pertinent medical, surgical, social history, OB history to HPI:32947} HPI     Prior to Admission medications  Medication Sig Start Date End Date Taking? Authorizing Provider  Ascorbic Acid (VITAMIN C) 1000 MG tablet Take 1,000 mg by mouth daily.    [provider]  cyanocobalamin  (VITAMIN B12) 500 MCG tablet Take 500 mcg by mouth daily.    [provider]  DULoxetine  (CYMBALTA ) 30 MG capsule Take 1 capsule (30 mg total) by mouth 2 (two) times daily. 07/06/24   Patel, Donika K, DO  famotidine (PEPCID) 40 MG tablet Take 40 mg by mouth daily.    [provider]  losartan (COZAAR) 25 MG tablet Take 25 mg by mouth daily.    [provider]  montelukast  (SINGULAIR ) 10  MG tablet Take 10 mg by mouth every morning.    [provider]  Omega-3 Fatty Acids (FISH OIL PO) Take 1 capsule by mouth daily.    [provider]  simvastatin  (ZOCOR ) 20 MG tablet Take 20 mg by mouth at bedtime.    [provider]  simvastatin  (ZOCOR ) 40 MG tablet Take 40 mg by mouth every other day. Patient taking differently: Take 20 mg by mouth daily.    [provider]  tamsulosin  (FLOMAX ) 0.4 MG CAPS capsule Take 0.4 mg by mouth daily.    [provider]  triamcinolone (KENALOG) 0.147 MG/GM topical spray Apply topically 2 (two) times daily.    [provider]  Vitamin D, Ergocalciferol, (DRISDOL) 50000 units CAPS capsule Take 50,000 Units by mouth every 14 (fourteen) days.    [provider]    Allergies: Losartan    Review of Systems  Updated Vital Signs BP (!) 143/83 (BP Location: Right Arm)   Pulse 74   Temp 97.8 F (36.6 C) (Oral)   Resp 16   Ht 5' 10 (1.778 m)   Wt 84.6 kg   SpO2 96%   BMI 26.75 kg/m   Physical Exam Constitutional:      General: He is not in acute distress. HENT:     Head: Normocephalic.     Comments: Small contusion to the left forehead Eyes:     Conjunctiva/sclera: Conjunctivae  normal.     Pupils: Pupils are equal, round, and reactive to light.  Cardiovascular:     Rate and Rhythm: Tachycardia present. Rhythm irregular.  Pulmonary:     Effort: Pulmonary effort is normal. No respiratory distress.  Abdominal:     General: There is no distension.     Tenderness: There is abdominal tenderness in the right lower quadrant.  Skin:    General: Skin is warm and dry.  Neurological:     General: No focal deficit present.     Mental Status: He is alert and oriented to person, place, and time. Mental status is at baseline.  Psychiatric:        Mood and Affect: Mood normal.        Behavior: Behavior normal.     (all labs ordered are listed, but only abnormal results are  displayed) Labs Reviewed  CBC WITH DIFFERENTIAL/PLATELET - Abnormal; Notable for the following components:      Result Value   WBC 20.2 (*)    Neutro Abs 17.7 (*)    Monocytes Absolute 1.3 (*)    Abs Immature Granulocytes 0.19 (*)    All other components within normal limits  COMPREHENSIVE METABOLIC PANEL WITH GFR - Abnormal; Notable for the following components:   CO2 21 (*)    Glucose, Bld 152 (*)    BUN 38 (*)    Creatinine, Ser 1.41 (*)    Calcium 10.4 (*)    AST 102 (*)    ALT 128 (*)    Total Bilirubin 1.3 (*)    GFR, Estimated 52 (*)    Anion gap 18 (*)    All other components within normal limits  LIPASE, BLOOD  URINALYSIS, ROUTINE W REFLEX MICROSCOPIC    EKG: None  Radiology: No results found.  {Document cardiac monitor, telemetry assessment procedure when appropriate:32947} Procedures   Medications Ordered in the ED  sodium chloride  0.9 % bolus 1,000 mL (has no administration in time range)  HYDROcodone -acetaminophen  (NORCO/VICODIN) 5-325 MG per tablet 1 tablet (1 tablet Oral Given 07/11/24 1513)  ondansetron  (ZOFRAN -ODT) disintegrating tablet 8 mg (8 mg Oral Given 07/11/24 1513)  iohexol  (OMNIPAQUE ) 300 MG/ML solution 100 mL (100 mLs Intravenous Contrast Given 07/11/24 1632)      {Click here for ABCD2, HEART and other calculators REFRESH Note before signing:1}                              Medical Decision Making Amount and/or Complexity of Data Reviewed ECG/medicine tests: ordered.   This patient presents to the ED with concern for abdominal pain. This involves an extensive number of treatment options, and is a complaint that carries with it a high risk of complications and morbidity.  The differential diagnosis includes appendicitis versus diverticulitis versus colitis versus bowel perforation versus ureteral colic or kidney infection versus other  Of note, the patient is also noted to be in atrial fibrillation, which appears to be new onset, with no prior  history of A-fib.  He is not anticoagulated.  He is asymptomatic from this perspective and we do not have a clear onset of his symptoms.  Therefore I do not think that he is an appropriate candidate for cardioversion at this time.  We will continue to monitor his heart rate, which remains under 110 bpm, and not requiring IV rate control at the moment.  I intend to evaluate first for surgical emergencies prior to initiation  of anticoagulation.  CT abdomen pelvis with contrast  Additional history obtained from patient's wife at bedside  I ordered and personally interpreted labs.  The pertinent results include:  ***  I ordered imaging studies including *** I independently visualized and interpreted imaging which showed *** I agree with the radiologist interpretation  The patient was maintained on a cardiac monitor.  I personally viewed and interpreted the cardiac monitored which showed an underlying rhythm of: Atrial fibrillation HR 100-110 bpm  Per my interpretation the patient's ECG shows ***  I ordered medication including ***  for ***  I have reviewed the patients home medicines and have made adjustments as needed  Test Considered: ***  I requested consultation with the ***,  and discussed lab and imaging findings as well as pertinent plan - they recommend: ***  After the interventions noted above, I reevaluated the patient and found that they have: {resolved/improved/worsened:23923::improved}  Social Determinants of Health:***  Dispostion:  After consideration of the diagnostic results and the patients response to treatment, I feel that the patent would benefit from ***.   {Document critical care time when appropriate  Document review of labs and clinical decision tools ie CHADS2VASC2, etc  Document your independent review of radiology images and any outside records  Document your discussion with family members, caretakers and with consultants  Document social determinants of  health affecting pt's care  Document your decision making why or why not admission, treatments were needed:32947:::1}   Final diagnoses:  None    ED Discharge Orders     None        "

## 2024-07-11 NOTE — ED Provider Triage Note (Signed)
 Emergency Medicine Provider Triage Evaluation Note  Luke Dalton , a 74 y.o. male  was evaluated in triage.  Pt complains of abdominal pain.  Has been going on for 4 days.  Pain is right lower quadrant but does radiate to the right flank.  Endorses nausea and diarrhea but no vomiting.  States it feels different when he urinates but denies any other urinary symptoms.  Review of Systems  Positive: See above Negative: See above  Physical Exam  BP (!) 143/83 (BP Location: Right Arm)   Pulse 74   Temp 97.8 F (36.6 C) (Oral)   Resp 16   Ht 5' 10 (1.778 m)   Wt 79.8 kg   SpO2 96%   BMI 25.25 kg/m  Gen:   Awake, no distress   Resp:  Normal effort  MSK:   Moves extremities without difficulty  Other:    Medical Decision Making  Medically screening exam initiated at 3:04 PM.  Appropriate orders placed.  Luke Dalton was informed that the remainder of the evaluation will be completed by another provider, this initial triage assessment does not replace that evaluation, and the importance of remaining in the ED until their evaluation is complete.  Work up started   Lang Norleen POUR, PA-C 07/11/24 1504
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
## 2024-07-12 ENCOUNTER — Inpatient Hospital Stay (HOSPITAL_COMMUNITY)

## 2024-07-12 ENCOUNTER — Other Ambulatory Visit (HOSPITAL_COMMUNITY): Payer: Self-pay

## 2024-07-12 ENCOUNTER — Telehealth (HOSPITAL_COMMUNITY): Payer: Self-pay

## 2024-07-12 DIAGNOSIS — I480 Paroxysmal atrial fibrillation: Secondary | ICD-10-CM | POA: Diagnosis not present

## 2024-07-12 DIAGNOSIS — Z01810 Encounter for preprocedural cardiovascular examination: Secondary | ICD-10-CM

## 2024-07-12 DIAGNOSIS — Z7901 Long term (current) use of anticoagulants: Secondary | ICD-10-CM | POA: Diagnosis not present

## 2024-07-12 DIAGNOSIS — I4819 Other persistent atrial fibrillation: Secondary | ICD-10-CM

## 2024-07-12 DIAGNOSIS — R9431 Abnormal electrocardiogram [ECG] [EKG]: Secondary | ICD-10-CM | POA: Diagnosis not present

## 2024-07-12 DIAGNOSIS — Z5181 Encounter for therapeutic drug level monitoring: Secondary | ICD-10-CM | POA: Diagnosis not present

## 2024-07-12 DIAGNOSIS — K3532 Acute appendicitis with perforation and localized peritonitis, without abscess: Secondary | ICD-10-CM | POA: Diagnosis not present

## 2024-07-12 LAB — CBC
HCT: 46 % (ref 39.0–52.0)
Hemoglobin: 15.4 g/dL (ref 13.0–17.0)
MCH: 32.7 pg (ref 26.0–34.0)
MCHC: 33.5 g/dL (ref 30.0–36.0)
MCV: 97.7 fL (ref 80.0–100.0)
Platelets: 269 K/uL (ref 150–400)
RBC: 4.71 MIL/uL (ref 4.22–5.81)
RDW: 13.1 % (ref 11.5–15.5)
WBC: 14.1 K/uL — ABNORMAL HIGH (ref 4.0–10.5)
nRBC: 0 % (ref 0.0–0.2)

## 2024-07-12 LAB — BASIC METABOLIC PANEL WITH GFR
Anion gap: 14 (ref 5–15)
BUN: 34 mg/dL — ABNORMAL HIGH (ref 8–23)
CO2: 24 mmol/L (ref 22–32)
Calcium: 9.3 mg/dL (ref 8.9–10.3)
Chloride: 98 mmol/L (ref 98–111)
Creatinine, Ser: 1.29 mg/dL — ABNORMAL HIGH (ref 0.61–1.24)
GFR, Estimated: 58 mL/min — ABNORMAL LOW
Glucose, Bld: 120 mg/dL — ABNORMAL HIGH (ref 70–99)
Potassium: 3.9 mmol/L (ref 3.5–5.1)
Sodium: 136 mmol/L (ref 135–145)

## 2024-07-12 LAB — PROTIME-INR
INR: 1.1 (ref 0.8–1.2)
Prothrombin Time: 15.1 s (ref 11.4–15.2)

## 2024-07-12 MED ORDER — METOPROLOL TARTRATE 50 MG PO TABS
50.0000 mg | ORAL_TABLET | Freq: Two times a day (BID) | ORAL | Status: DC
  Administered 2024-07-12 – 2024-07-19 (×15): 50 mg via ORAL
  Filled 2024-07-12 (×12): qty 1
  Filled 2024-07-12: qty 2
  Filled 2024-07-12 (×2): qty 1

## 2024-07-12 MED ORDER — FENTANYL CITRATE (PF) 100 MCG/2ML IJ SOLN
INTRAMUSCULAR | Status: AC
  Filled 2024-07-12: qty 2

## 2024-07-12 MED ORDER — FENTANYL CITRATE (PF) 100 MCG/2ML IJ SOLN
INTRAMUSCULAR | Status: AC | PRN
  Administered 2024-07-12: 50 ug via INTRAVENOUS

## 2024-07-12 MED ORDER — HEPARIN (PORCINE) 25000 UT/250ML-% IV SOLN
1700.0000 [IU]/h | INTRAVENOUS | Status: DC
  Administered 2024-07-12: 1200 [IU]/h via INTRAVENOUS
  Administered 2024-07-13 – 2024-07-14 (×2): 1700 [IU]/h via INTRAVENOUS
  Filled 2024-07-12 (×3): qty 250

## 2024-07-12 MED ORDER — SODIUM CHLORIDE 0.9 % IV SOLN
INTRAVENOUS | Status: AC
  Filled 2024-07-12: qty 500

## 2024-07-12 MED ORDER — MIDAZOLAM HCL (PF) 2 MG/2ML IJ SOLN
INTRAMUSCULAR | Status: AC | PRN
  Administered 2024-07-12: 1 mg via INTRAVENOUS

## 2024-07-12 MED ORDER — SODIUM CHLORIDE 0.9% FLUSH
5.0000 mL | Freq: Three times a day (TID) | INTRAVENOUS | Status: DC
  Administered 2024-07-12 – 2024-07-19 (×20): 5 mL

## 2024-07-12 MED ORDER — MIDAZOLAM HCL 2 MG/2ML IJ SOLN
INTRAMUSCULAR | Status: AC
  Filled 2024-07-12: qty 2

## 2024-07-12 MED ORDER — LACTATED RINGERS IV SOLN: INTRAVENOUS | Status: AC

## 2024-07-12 NOTE — Progress Notes (Signed)
 " PROGRESS NOTE [name]  FMW:[id number] DOB: [birth year] DOA: 07/11/2024 PCP: Chrystal Lamarr RAMAN, MD  Brief Narrative/Hospital Course: Luke Dalton is a 74 y.o. male with PMH of f hypertension, dyslipidemia, BPH and spinal stenosis s/p of steroid injection a week ago came to ED for worsening abdominal pain RUQ  sicne Wednesday  07/06/24  with associated nausea, some diarrhea which has been improved now. In ED: vital stable with borderline tachycardia, EKG with new onset A-fib/flutter. Labs with leukocytosis at 12.2, CO2 21, BUN 38, creatinine 1.41, AST 102, ALT 128, T. bili 1.3.  UA with protein urea. CT abdomen and pelvis >> concern of perforated appendicitis with abdominal and pelvic abscesses with some associated SBO likely functional as opposed to mechanical. General surgery was consulted and they would like to have IR placed pelvic drains and cardiology to do cardiac clearance because of new onset A-fib/A-flutter before taking him to the OR IR and cardiology was also consulted and patient was started on Zosyn  and heparin  infusion.   Subjective: Seen and examined today C/o ongoing lRLQ PAIN, Overnight  afebrile, VSS, Labs-on on admission significant leukocytosis, creatinine at 1.5> improving to 1.2, WBC improving 16>14 and mild transaminitis  Assessment and plan:  Perforated appendicitis with abdominal and pelvic abscesses: Having abdominal pain since 1/7,CT scan shows perforated appendicitis with abdominal and pelvic abscesses. [facility] IR consulted continue Zosyn , n.p.o. IV fluids. Plan for IR drain placement and continue on conservative management if patient does not improve on this he may need inpatient operative intervention otherwise plan for conservative management, repeat colonoscopy in 6 weeks and follow-up for interval appendicectomy.   New onset A-fib/flutter with RVR: Likely triggered from perforated appendicitis. Cardio consulted currently placed on heparin  on  admission, monitoring telemetry. Heparin  on hold hopefully resume soon once cleared by IR and surgery.  Placed on metoprolol  50 twice daily.  Hypertension On low-dose losartan currently on hold due to AKI.  On metoprolol  as above   AKI Likely prerenal due to poor p.o. intake and #1, monitor electrolytes, proving, continue IV fluids Recent Labs    07/11/24 1521 07/12/24 0642  BUN 38* 34*  CREATININE 1.41* 1.29*  CO2 21* 24  K 3.5 3.9    Mild transaminitis Likely secondary to dehydration and infection.   Mild hypercalcemia.  Likely from dehydration calcium improved    Dyslipidemia. Holding home Zocor  due to transaminitis   BPH. Continue home FlomaX   DVT prophylaxis: Heparin  drip Code Status:   Code Status: Full Code Family Communication: plan of care discussed with patient at bedside. Patient status is: Remains hospitalized because of severity of illness Level of care: Progressive   Dispo: The patient is from: HOME            Anticipated disposition: TBD Objective: Vitals last 24 hrs: Vitals:   07/12/24 0400 07/12/24 0415 07/12/24 0721 07/12/24 0935  BP: 136/73 125/74 126/78 126/84  Pulse: 95 91 (!) 108 (!) 102  Resp: (!) 24 19 19 18   Temp:   98.6 F (37 C) 98.3 F (36.8 C)  TempSrc:   Oral Oral  SpO2: 97% 97% 100% 95%  Weight:   84.6 kg   Height:   5' 10 (1.778 m)     Physical Examination: General exam: alert awake, oriented, older than stated age HEENT:Oral mucosa moist, Ear/Nose WNL grossly Respiratory system: Bilaterally clear BS,no use of accessory muscle Cardiovascular system: S1 & S2 +, No JVD. Gastrointestinal system: Abdomen soft, tender RLQ, ND, BS+ Nervous System:  Alert, awake, moving all extremities,and following commands. Extremities: extremities warm, leg edema neg Skin: Warm, no rashes MSK: Normal muscle bulk,tone, power   Medications reviewed:  Scheduled Meds:  DULoxetine   30 mg Oral BID   metoprolol  tartrate  50 mg Oral BID    montelukast   10 mg Oral BH-q7a   sodium chloride  flush  3 mL Intravenous Q12H   tamsulosin   0.4 mg Oral Daily   Continuous Infusions:  lactated ringers      piperacillin -tazobactam (ZOSYN )  IV 3.375 g (07/12/24 0946)   Diet: Diet Order             Diet NPO time specified Except for: Sips with Meds  Diet effective now                   Data Reviewed: I have personally reviewed following labs and imaging studies ( see epic result tab) CBC: Recent Labs  Lab 07/11/24 1521 07/12/24 0642  WBC 20.2* 14.1*  NEUTROABS 17.7*  --   HGB 17.0 15.4  HCT 49.2 46.0  MCV 94.4 97.7  PLT 344 269   CMP: Recent Labs  Lab 07/11/24 1521 07/12/24 0642  NA 137 136  K 3.5 3.9  CL 98 98  CO2 21* 24  GLUCOSE 152* 120*  BUN 38* 34*  CREATININE 1.41* 1.29*  CALCIUM 10.4* 9.3   GFR: Estimated Creatinine Clearance: 51.9 mL/min (A) (by C-G formula based on SCr of 1.29 mg/dL (H)). Recent Labs  Lab 07/11/24 1521  AST 102*  ALT 128*  ALKPHOS 102  BILITOT 1.3*  PROT 7.8  ALBUMIN 3.8    Recent Labs  Lab 07/11/24 1521  LIPASE 17   No results for input(s): AMMONIA in the last 168 hours. Coagulation Profile: No results for input(s): INR, PROTIME in the last 168 hours. Unresulted Labs (From admission, onward)     Start     Ordered   07/13/24 0500  CBC  Daily,   R      07/11/24 2002   07/13/24 0500  Comprehensive metabolic panel with GFR  Daily,   R      07/12/24 0901   07/12/24 0918  Protime-INR  ONCE - STAT,   STAT        07/12/24 0918           Antimicrobials/Microbiology: Anti-infectives (From admission, onward)    Start     Dose/Rate Route Frequency Ordered Stop   07/12/24 0300  piperacillin -tazobactam (ZOSYN ) IVPB 3.375 g  Status:  Discontinued        3.375 g 12.5 mL/hr over 240 Minutes Intravenous Every 8 hours 07/11/24 1842 07/11/24 1843   07/12/24 0100  piperacillin -tazobactam (ZOSYN ) IVPB 3.375 g        3.375 g 12.5 mL/hr over 240 Minutes Intravenous Every 8  hours 07/11/24 1843     07/11/24 1815  piperacillin -tazobactam (ZOSYN ) IVPB 3.375 g        3.375 g 100 mL/hr over 30 Minutes Intravenous  Once 07/11/24 1814 07/11/24 1946         Component Value Date/Time   SDES  07/11/2024 1840    BLOOD RIGHT WRIST Performed at [facility], [address] [place], [place] [address]    SPECREQUEST  07/11/2024 1840    BOTTLES DRAWN AEROBIC AND ANAEROBIC Blood Culture results may not be optimal due to an inadequate volume of blood received in culture bottles Performed at [facility], [address], [place], [place] [address]  CULT  07/11/2024 1840    NO GROWTH < 12 HOURS Performed at [facility], [address] [place], [place] [address]    REPTSTATUS PENDING 07/11/2024 1840    Procedures: Mennie LAMY, MD [facility] 07/12/2024, 11:06 AM   "

## 2024-07-12 NOTE — Hospital Course (Addendum)
 Luke Dalton is a 74 y.o. male with PMH of f hypertension, dyslipidemia, BPH and spinal stenosis s/p of steroid injection a week ago came to ED for worsening abdominal pain RUQ  sicne Wednesday  07/06/24  with associated nausea, some diarrhea which has been improved now. In ED: vital stable with borderline tachycardia, EKG with new onset A-fib/flutter. Labs with leukocytosis at 12.2, CO2 21, BUN 38, creatinine 1.41, AST 102, ALT 128, T. bili 1.3.  UA with protein urea. CT abdomen and pelvis >> concern of perforated appendicitis with abdominal and pelvic abscesses with some associated SBO likely functional as opposed to mechanical. General surgery was consulted and they would like to have IR placed pelvic drains and cardiology to do cardiac clearance because of new onset A-fib/A-flutter before taking him to the OR IR and cardiology was also consulted and patient was started on Zosyn  and heparin  infusion.   Subjective: Seen and examined today C/o ongoing lRLQ PAIN, Overnight  afebrile, VSS, Labs-on on admission significant leukocytosis, creatinine at 1.5> improving to 1.2, WBC improving 16>14 and mild transaminitis  Assessment and plan:  Perforated appendicitis with abdominal and pelvic abscesses: Having abdominal pain since 1/7,CT scan shows perforated appendicitis with abdominal and pelvic abscesses. [facility] IR consulted continue Zosyn , n.p.o. IV fluids. Plan for IR drain placement and continue on conservative management if patient does not improve on this he may need inpatient operative intervention otherwise plan for conservative management, repeat colonoscopy in 6 weeks and follow-up for interval appendicectomy.   New onset A-fib/flutter with RVR: Likely triggered from perforated appendicitis. Cardio consulted currently placed on heparin  on admission, monitoring telemetry. Heparin  on hold hopefully resume soon once cleared by IR and surgery.  Placed on metoprolol  50 twice  daily.  Hypertension On low-dose losartan currently on hold due to AKI.  On metoprolol  as above   AKI Likely prerenal due to poor p.o. intake and #1, monitor electrolytes, proving, continue IV fluids Recent Labs    07/11/24 1521 07/12/24 0642  BUN 38* 34*  CREATININE 1.41* 1.29*  CO2 21* 24  K 3.5 3.9    Mild transaminitis Likely secondary to dehydration and infection.   Mild hypercalcemia.  Likely from dehydration calcium improved    Dyslipidemia. Holding home Zocor  due to transaminitis   BPH. Continue home FlomaX   DVT prophylaxis: Heparin  drip Code Status:   Code Status: Full Code Family Communication: plan of care discussed with patient at bedside. Patient status is: Remains hospitalized because of severity of illness Level of care: Progressive   Dispo: The patient is from: HOME            Anticipated disposition: TBD Objective: Vitals last 24 hrs: Vitals:   07/12/24 0400 07/12/24 0415 07/12/24 0721 07/12/24 0935  BP: 136/73 125/74 126/78 126/84  Pulse: 95 91 (!) 108 (!) 102  Resp: (!) 24 19 19 18   Temp:   98.6 F (37 C) 98.3 F (36.8 C)  TempSrc:   Oral Oral  SpO2: 97% 97% 100% 95%  Weight:   84.6 kg   Height:   5' 10 (1.778 m)     Physical Examination: General exam: alert awake, oriented, older than stated age HEENT:Oral mucosa moist, Ear/Nose WNL grossly Respiratory system: Bilaterally clear BS,no use of accessory muscle Cardiovascular system: S1 & S2 +, No JVD. Gastrointestinal system: Abdomen soft, tender RLQ, ND, BS+ Nervous System: Alert, awake, moving all extremities,and following commands. Extremities: extremities warm, leg edema neg Skin: Warm, no rashes MSK: Normal muscle bulk,tone,  power   Medications reviewed:  Scheduled Meds:  DULoxetine   30 mg Oral BID   metoprolol  tartrate  50 mg Oral BID   montelukast   10 mg Oral BH-q7a   sodium chloride  flush  3 mL Intravenous Q12H   tamsulosin   0.4 mg Oral Daily   Continuous Infusions:   lactated ringers      piperacillin -tazobactam (ZOSYN )  IV 3.375 g (07/12/24 0946)   Diet: Diet Order             Diet NPO time specified Except for: Sips with Meds  Diet effective now

## 2024-07-12 NOTE — Progress Notes (Signed)
 PHARMACY - ANTICOAGULATION CONSULT NOTE  Pharmacy Consult for heparin   Indication: atrial fibrillation  Allergies[1]  Patient Measurements: Height: 5' 10 (177.8 cm) Weight: 84.6 kg (186 lb 8.2 oz) IBW/kg (Calculated) : 73 HEPARIN  DW (KG): 84.6  Vital Signs: Temp: Luke.2 F (36.8 C) (01/13 1144) Temp Source: Oral (01/13 1144) BP: 128/71 (01/13 1445) Pulse Rate: 95 (01/13 1445)  Labs: Recent Labs    07/11/24 1521 07/12/24 0642 07/12/24 1130  HGB 17.0 15.4  --   HCT 49.2 46.0  --   PLT 344 269  --   LABPROT  --   --  15.1  INR  --   --  1.1  CREATININE 1.41* 1.29*  --     Estimated Creatinine Clearance: 51.9 mL/min (A) (by C-G formula based on SCr of 1.29 mg/dL (H)).  Assessment: Patient is a 74 y.o Dalton who presented to the ED on 07/11/24 with c/o abdominal pain.  He was found to be in afib and abdominal CT showed perforated appendicitis with multiple pelvic abscesses. [facility] recom.  perc drains for abscess.  Pharmacy has been consulted to dose heparin  for afib.  Today, 07/12/2024: Heparin  held at 05:00 for IR drain placement, which was completed ~15:00 today Per IR MD, OK to resume heparin  at this time (currently 15:40) CBC with some dilutional changes, but remains WNL No IR complications per note, <5 ml EBL SCr elevated on admission (no baseline for comparison); remains elevated today but improving  Goal of Therapy:  Heparin  level 0.3-0.7 units/ml Monitor platelets by anticoagulation protocol: Yes   Plan:  Resume heparin  infusion at 1200 units/hr; no bolus Check heparin  level 8 hr after restart Daily CBC; daily heparin  level when stable Monitor for s/s bleeding or thrombosis F/u plans for long-term anticoagulation; Pharmacy to provide education/coupons as needed (Rx patient advocate to provide DOAC copays in chart)  Bard Jeans, PharmD, BCPS [phone] 07/12/2024, 3:44 PM    [1]  Allergies Allergen Reactions   Losartan     Other Reaction(s): tachycardia,  hypotension at 50 mg dose
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
## 2024-07-12 NOTE — Progress Notes (Signed)
" ° °  Brief Progress Note   _____________________________________________________________________________________________________________  Patient Name: Luke Dalton Patient DOB: [birth year] Date: @TODAY @      Data: Reviewed labs, notes, VS.    Action: No action needed at this time.    Response:    _____________________________________________________________________________________________________________  [name] RN Expeditor Sharolyn JONETTA Batman Please contact us  directly via secure chat (search for [name]) or by calling us  at [phone] [name]).  "

## 2024-07-12 NOTE — ED Notes (Signed)
 4W aware of Pt being transported.

## 2024-07-12 NOTE — Procedures (Signed)
 Interventional Radiology Procedure Note  Procedure: CT guided right transgluteal drain placement   Findings: Please refer to procedural dictation for full description. 10 Fr pigtail drain via right transgluteal approach with aspiration of approximately 150 mL purulent fluid.  Drain to bulb suction. Sample sent for culture.   Complications: None immediate  Estimated Blood Loss: < 5 mL  Recommendations: Follow cultures. Keep to bulb suction until drainage clears. IR will follow.   Ester Sides, MD

## 2024-07-12 NOTE — Sedation Documentation (Signed)
 RN Priyansh Pry pulled 2 mg Versed  and 100 mcg Fentanyl  in Ct pysix. Pt. Received 1 mg Versed  and 50 mcg Fentanyl  throughout the procedure. RN Shahana Capes wasted  1 mg Versed  and  50 mcg Fentanyl  with Rolan Cohens RN IR med room pysix.

## 2024-07-12 NOTE — Telephone Encounter (Signed)
 Pharmacy Patient Advocate Encounter  Insurance verification completed.    The patient is insured through [employer]. Patient has Medicare and is not eligible for a copay card, but may be able to apply for patient assistance or Medicare RX Payment Plan (Patient Must reach out to their plan, if eligible for payment plan), if available.    Ran test claim for Eliquis  5mg  tablet and the current 30 day co-pay is $248.41 due to deductible.  Ran test claim for Xarelto 20mg  tablet and the current 30 day co-pay is $206.14 due to deductible.  This test claim was processed through [facility]- copay amounts may vary at other pharmacies due to pharmacy/plan contracts, or as the patient moves through the different stages of their insurance plan.
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
## 2024-07-12 NOTE — Progress Notes (Signed)
 Echocardiogram 2D Echocardiogram has been performed.  Luke Dalton 07/12/2024, 1:57 PM

## 2024-07-12 NOTE — Progress Notes (Signed)
 Reviewed patients echo Normal RV/LV function no effusion Trivial AR No significant valve dx  Clear to have surgery for ruptured appendix with general anesthesia  Luke Emmer MD [facility]

## 2024-07-13 ENCOUNTER — Inpatient Hospital Stay (HOSPITAL_COMMUNITY)

## 2024-07-13 DIAGNOSIS — I4819 Other persistent atrial fibrillation: Secondary | ICD-10-CM

## 2024-07-13 DIAGNOSIS — I480 Paroxysmal atrial fibrillation: Secondary | ICD-10-CM | POA: Diagnosis not present

## 2024-07-13 DIAGNOSIS — Z7901 Long term (current) use of anticoagulants: Secondary | ICD-10-CM | POA: Diagnosis not present

## 2024-07-13 DIAGNOSIS — Z5181 Encounter for therapeutic drug level monitoring: Secondary | ICD-10-CM | POA: Diagnosis not present

## 2024-07-13 DIAGNOSIS — K3532 Acute appendicitis with perforation and localized peritonitis, without abscess: Secondary | ICD-10-CM | POA: Diagnosis not present

## 2024-07-13 LAB — HEPARIN LEVEL (UNFRACTIONATED)
Heparin Unfractionated: <0.1 [IU]/mL — ABNORMAL LOW (ref 0.30–0.70)
Heparin Unfractionated: 0.25 [IU]/mL — ABNORMAL LOW (ref 0.30–0.70)
Heparin Unfractionated: 0.6 [IU]/mL (ref 0.30–0.70)

## 2024-07-13 LAB — CBC
HCT: 42.6 % (ref 39.0–52.0)
Hemoglobin: 14.8 g/dL (ref 13.0–17.0)
MCH: 33.2 pg (ref 26.0–34.0)
MCHC: 34.7 g/dL (ref 30.0–36.0)
MCV: 95.5 fL (ref 80.0–100.0)
Platelets: 306 K/uL (ref 150–400)
RBC: 4.46 MIL/uL (ref 4.22–5.81)
RDW: 12.9 % (ref 11.5–15.5)
WBC: 11.8 K/uL — ABNORMAL HIGH (ref 4.0–10.5)
nRBC: 0 % (ref 0.0–0.2)

## 2024-07-13 LAB — ECHOCARDIOGRAM COMPLETE
Height: 70 in
S' Lateral: 3.1 cm
Weight: 2984.15 [oz_av]

## 2024-07-13 LAB — BLOOD CULTURE ID PANEL (REFLEXED) - BCID2

## 2024-07-13 LAB — COMPREHENSIVE METABOLIC PANEL WITH GFR
ALT: 92 U/L — ABNORMAL HIGH (ref 0–44)
AST: 49 U/L — ABNORMAL HIGH (ref 15–41)
Albumin: 3.2 g/dL — ABNORMAL LOW (ref 3.5–5.0)
Alkaline Phosphatase: 85 U/L (ref 38–126)
Anion gap: 13 (ref 5–15)
BUN: 34 mg/dL — ABNORMAL HIGH (ref 8–23)
CO2: 23 mmol/L (ref 22–32)
Calcium: 9.2 mg/dL (ref 8.9–10.3)
Chloride: 102 mmol/L (ref 98–111)
Creatinine, Ser: 1.22 mg/dL (ref 0.61–1.24)
GFR, Estimated: >60 mL/min
Glucose, Bld: 100 mg/dL — ABNORMAL HIGH (ref 70–99)
Potassium: 3.3 mmol/L — ABNORMAL LOW (ref 3.5–5.1)
Sodium: 138 mmol/L (ref 135–145)
Total Bilirubin: 1.4 mg/dL — ABNORMAL HIGH (ref 0.0–1.2)
Total Protein: 6.6 g/dL (ref 6.5–8.1)

## 2024-07-13 MED ORDER — LACTATED RINGERS IV SOLN: INTRAVENOUS | Status: DC

## 2024-07-13 MED ORDER — POTASSIUM CHLORIDE 10 MEQ/100ML IV SOLN
10.0000 meq | INTRAVENOUS | Status: AC
  Administered 2024-07-13 (×6): 10 meq via INTRAVENOUS
  Filled 2024-07-13 (×5): qty 100

## 2024-07-13 NOTE — Progress Notes (Signed)
 "   Subjective:  Denies SSCP, palpitations or Dyspnea Has NG tube and appy drain in  Objective:  Vitals:   07/12/24 1700 07/12/24 2131 07/13/24 0038 07/13/24 0527  BP: (!) 140/69 119/75 120/69 112/67  Pulse: (!) 101 99 87 90  Resp: 18 19 19 18   Temp: 98.2 F (36.8 C) 98.2 F (36.8 C) 98.4 F (36.9 C) 98.4 F (36.9 C)  TempSrc: Oral Oral Oral Oral  SpO2: 97% 98% 98% 97%  Weight:      Height: 5' 10 (1.778 m)       Intake/Output from previous day:  Intake/Output Summary (Last 24 hours) at 07/13/2024 0832 Last data filed at 07/13/2024 0819 Gross per 24 hour  Intake 513.18 ml  Output 965 ml  Net -451.82 ml    Physical Exam: Feels miserable NG tube  Lungs clear No murmur Abdomen with right posterior appy drain in No edema  Lab Results: Basic Metabolic Panel: Recent Labs    07/12/24 0642 07/13/24 0029  NA 136 138  K 3.9 3.3*  CL 98 102  CO2 24 23  GLUCOSE 120* 100*  BUN 34* 34*  CREATININE 1.29* 1.22  CALCIUM 9.3 9.2   Liver Function Tests: Recent Labs    07/11/24 1521 07/13/24 0029  AST 102* 49*  ALT 128* 92*  ALKPHOS 102 85  BILITOT 1.3* 1.4*  PROT 7.8 6.6  ALBUMIN 3.8 3.2*   Recent Labs    07/11/24 1521  LIPASE 17   CBC: Recent Labs    07/11/24 1521 07/12/24 0642 07/13/24 0029  WBC 20.2* 14.1* 11.8*  NEUTROABS 17.7*  --   --   HGB 17.0 15.4 14.8  HCT 49.2 46.0 42.6  MCV 94.4 97.7 95.5  PLT 344 269 306     Imaging: ECHOCARDIOGRAM COMPLETE Result Date: 07/13/2024    ECHOCARDIOGRAM REPORT   Patient Name:   Luke Dalton Date of Exam: 07/12/2024 Medical Rec #:  [id number]        Height:       70.0 in Accession #:    [id number]       Weight:       186.5 lb Date of Birth:  1951/01/28        BSA:          2.026 m Patient Age:    73 years         BP:           113/66 mmHg Patient Gender: M                HR:           95 bpm. Exam Location:  Inpatient Procedure: 2D Echo, Cardiac Doppler and Color Doppler (Both Spectral and Color             Flow Doppler were utilized during procedure). Indications:     Abnormal ECG 194.31  History:         Patient has no prior history of Echocardiogram examinations.                  Risk Factors:Hypertension.  Sonographer:     Merlynn Argyle Referring Phys:  [id number] SUMAYYA AMIN Diagnosing Phys: Darryle Decent MD  Sonographer Comments: Image acquisition challenging due to respiratory motion. IMPRESSIONS  1. Left ventricular ejection fraction, by estimation, is 60 to 65%. The left ventricle has normal function. The left ventricle has no regional wall motion abnormalities. Indeterminate diastolic filling due to E-A fusion.  2.  Right ventricular systolic function is normal. The right ventricular size is normal. There is normal pulmonary artery systolic pressure. The estimated right ventricular systolic pressure is 22.4 mmHg.  3. The mitral valve is grossly normal. Trivial mitral valve regurgitation. No evidence of mitral stenosis.  4. The aortic valve is tricuspid. Aortic valve regurgitation is mild. No aortic stenosis is present.  5. The inferior vena cava is normal in size with greater than 50% respiratory variability, suggesting right atrial pressure of 3 mmHg. FINDINGS  Left Ventricle: Left ventricular ejection fraction, by estimation, is 60 to 65%. The left ventricle has normal function. The left ventricle has no regional wall motion abnormalities. The left ventricular internal cavity size was normal in size. There is  no left ventricular hypertrophy. Indeterminate diastolic filling due to E-A fusion. Right Ventricle: The right ventricular size is normal. No increase in right ventricular wall thickness. Right ventricular systolic function is normal. There is normal pulmonary artery systolic pressure. The tricuspid regurgitant velocity is 2.20 m/s, and  with an assumed right atrial pressure of 3 mmHg, the estimated right ventricular systolic pressure is 22.4 mmHg. Left Atrium: Left atrial size was normal in size. Right  Atrium: Right atrial size was normal in size. Pericardium: Trivial pericardial effusion is present. Mitral Valve: The mitral valve is grossly normal. Trivial mitral valve regurgitation. No evidence of mitral valve stenosis. Tricuspid Valve: The tricuspid valve is grossly normal. Tricuspid valve regurgitation is trivial. No evidence of tricuspid stenosis. Aortic Valve: The aortic valve is tricuspid. Aortic valve regurgitation is mild. No aortic stenosis is present. Pulmonic Valve: The pulmonic valve was grossly normal. Pulmonic valve regurgitation is trivial. No evidence of pulmonic stenosis. Aorta: The aortic root and ascending aorta are structurally normal, with no evidence of dilitation. Venous: The inferior vena cava is normal in size with greater than 50% respiratory variability, suggesting right atrial pressure of 3 mmHg. IAS/Shunts: The atrial septum is grossly normal.  LEFT VENTRICLE PLAX 2D LVIDd:         4.00 cm LVIDs:         3.10 cm LV PW:         1.00 cm LV IVS:        1.10 cm  RIGHT VENTRICLE          IVC RV Basal diam:  3.60 cm  IVC diam: 1.00 cm TAPSE (M-mode): 2.6 cm LEFT ATRIUM             Index        RIGHT ATRIUM           Index LA diam:        4.30 cm 2.12 cm/m   RA Area:     14.80 cm LA Vol (A2C):   35.4 ml 17.47 ml/m  RA Volume:   29.80 ml  14.71 ml/m LA Vol (A4C):   33.2 ml 16.38 ml/m LA Biplane Vol: 35.0 ml 17.27 ml/m  AORTIC VALVE             PULMONIC VALVE LVOT Vmax:   64.00 cm/s  PR End Diast Vel: 6.15 msec LVOT Vmean:  46.200 cm/s LVOT VTI:    0.117 m  AORTA Ao Root diam: 3.50 cm Ao Asc diam:  3.80 cm TRICUSPID VALVE TR Peak grad:   19.4 mmHg TR Vmax:        220.00 cm/s  SHUNTS Systemic VTI: 0.12 m Darryle Decent MD Electronically signed by Darryle Decent MD Signature Date/Time: 07/12/2024/2:09:26 PM  Final (Updated)    CT GUIDED PERITONEAL/RETROPERITONEAL FLUID DRAIN BY PERC CATH Result Date: 07/12/2024 INDICATION: 74 year old male with history of perforated appendicitis  complicated by pelvic abscess formation. EXAM: CT PERC DRAIN PERITONEAL ABCESS COMPARISON:  07/11/2024 MEDICATIONS: The patient is currently admitted to the hospital and receiving intravenous antibiotics. The antibiotics were administered within an appropriate time frame prior to the initiation of the procedure. ANESTHESIA/SEDATION: Moderate (conscious) sedation was employed during this procedure. A total of Versed  1 mg and Fentanyl  50 mcg was administered intravenously. Moderate Sedation Time: 13 minutes. The patient's level of consciousness and vital signs were monitored continuously by radiology nursing throughout the procedure under my direct supervision. CONTRAST:  None COMPLICATIONS: None immediate. PROCEDURE: RADIATION DOSE REDUCTION: This exam was performed according to the departmental dose-optimization program which includes automated exposure control, adjustment of the mA and/or kV according to patient size and/or use of iterative reconstruction technique. Informed written consent was obtained from the patient after a discussion of the risks, benefits and alternatives to treatment. The patient was placed prone on the CT gantry and a pre procedural CT was performed re-demonstrating the known abscess/fluid collection within the pelvis. The procedure was planned. A timeout was performed prior to the initiation of the procedure. The right trans gluteal region was prepped and draped in the usual sterile fashion. The overlying soft tissues were anesthetized with 1% lidocaine  with epinephrine. Appropriate trajectory was planned with the use of a 22 gauge spinal needle. An 18 gauge trocar needle was advanced into the abscess/fluid collection and a short Amplatz super stiff wire was coiled within the collection. Appropriate positioning was confirmed with a limited CT scan. The tract was serially dilated allowing placement of a 10 French all-purpose drainage catheter. Appropriate positioning was confirmed with a  limited postprocedural CT scan. Approximately 100 ml of purulent fluid was aspirated. The tube was connected to a bulb suction and sutured in place. A dressing was placed. The patient tolerated the procedure well without immediate post procedural complication. IMPRESSION: Successful CT guided placement of a right transgluteal 10 French all purpose drain catheter into the pelvic abscess with aspiration of 100 mL of purulent fluid. Samples were sent to the laboratory as requested by the ordering clinical team. Ester Sides, MD [facility] Radiology Electronically Signed   By: Ester Sides M.D.   On: 07/12/2024 15:23   DG Abd Portable 1V Result Date: 07/12/2024 CLINICAL DATA:  Nasogastric tube placement. EXAM: PORTABLE ABDOMEN - 1 VIEW COMPARISON:  Yesterday FINDINGS: Nasogastric tube tip is seen in proximal stomach. Small bowel dilatation is noted concerning for distal small bowel obstruction. IMPRESSION: Nasogastric tube tip seen in proximal stomach. Small bowel dilatation is noted concerning for distal small bowel obstruction. Electronically Signed   By: Lynwood Landy Raddle M.D.   On: 07/12/2024 12:46   CT ABDOMEN PELVIS W CONTRAST Result Date: 07/11/2024 CLINICAL DATA:  Right lower quadrant abdominal pain. EXAM: CT ABDOMEN AND PELVIS WITH CONTRAST TECHNIQUE: Multidetector CT imaging of the abdomen and pelvis was performed using the standard protocol following bolus administration of intravenous contrast. RADIATION DOSE REDUCTION: This exam was performed according to the departmental dose-optimization program which includes automated exposure control, adjustment of the mA and/or kV according to patient size and/or use of iterative reconstruction technique. CONTRAST:  OMNIPAQUE  IOHEXOL  300 MG/ML  SOLN COMPARISON:  None Available. FINDINGS: Lower chest: The lung bases demonstrate streaky bibasilar atelectasis or scarring changes. No infiltrates or effusions. No  pericardial  effusion. Hepatobiliary: No focal liver abnormality is seen. No gallstones, gallbladder wall thickening, or biliary dilatation. Pancreas: Unremarkable. No pancreatic ductal dilatation or surrounding inflammatory changes. Spleen: Normal in size without focal abnormality. Adrenals/Urinary Tract: The adrenal glands and kidneys are normal. Benign bilateral parapelvic renal cysts not requiring any further imaging evaluation or follow-up. The bladder is unremarkable. Stomach/Bowel: The stomach is unremarkable. The duodenum is normal. Dilated proximal and mid small bowel loops with air-fluid levels. Transition to decompressed and inflamed appearing mid distal ileal loops of small bowel. The colon is unremarkable. No colonic lesions. Advanced sigmoid colon diverticulosis. Extensive inflammatory process in the right lower quadrant and upper right pelvis. CT findings consistent with perforated appendicitis with abdominal and pelvic abscesses. The largest right lower quadrant abscess measures 6.4 x 3.4 cm and contains a small amount non dependent gas. The pelvic abscess is between the bladder and the rectum and measures approximately 7.3 x 5.0 cm. Multiple other smaller likely communicating abscesses in the right lower quadrant and pelvis. Vascular/Lymphatic: Atherosclerotic calcifications involving the aorta and iliac arteries but no aneurysm. The branch vessels are patent. The major venous structures are patent. Small mesenteric and retroperitoneal lymph nodes, likely reactive. No pelvic adenopathy. Reproductive: The prostate gland and seminal vesicles are unremarkable. Bilateral hydroceles are noted. Other: No free air is identified but small locules gas are noted in the abscesses. Musculoskeletal: No significant bony findings. Moderate degenerative changes involving the spine. IMPRESSION: 1. CT findings consistent with perforated appendicitis with abdominal and pelvic abscesses. Recommend surgery consultation.  2. Associated small bowel obstruction (likely functional as opposed to mechanical). Inflamed enhancing right lower quadrant 3. Advanced sigmoid colon diverticulosis. 4. Aortic atherosclerosis. Aortic Atherosclerosis (ICD10-I70.0). Electronically Signed   By: MYRTIS Stammer M.D.   On: 07/11/2024 17:56    Cardiac Studies:  ECG:  Orders placed or performed during the hospital encounter of 07/11/24   EKG 12-Lead   EKG 12-Lead   EKG 12-Lead   EKG 12-Lead     Telemetry:  Echo:   Medications:    DULoxetine   30 mg Oral BID   metoprolol  tartrate  50 mg Oral BID   montelukast   10 mg Oral BH-q7a   sodium chloride  flush  3 mL Intravenous Q12H   sodium chloride  flush  5 mL Intracatheter Q8H   tamsulosin   0.4 mg Oral Daily      heparin  1,500 Units/hr (07/13/24 0330)   lactated ringers  Stopped (07/12/24 1407)   piperacillin -tazobactam (ZOSYN )  IV 12.5 mL/hr at 07/13/24 0330    Assessment/Plan:    Preoperative:  Echo has normal EF no clinical CAD Prior to admission functional capacity good. Afib rates are fine Covering anticoagulation with heparin . Stop at least 1 hour before OR. On Zosyn  ? Plan for definitive surgery today with possible need for sigmoid colectomy as well Post op may need iv inderal q 4 hours for rate control Can use Esmolol in OR if needed. Need guidance from surgery on when anticoagulation can be restarted post operatively   Luke Dalton 07/13/2024, 8:32 AM    "

## 2024-07-13 NOTE — Progress Notes (Addendum)
 Patient ID: Luke Dalton, male   DOB: 04/12/1951, 75 y.o.   MRN: [id number]  Subjective: Patient is a 75 yo male s/p R transgluteal drain placement on 07/12/2024 with Dr. Jennefer. Patient is doing well today, he reports no fever, bleeding, abnormal drainage or pain at the surgical site. Patient lives with his wife and will have assistance post operatively.  Physical exam: Vitals: Stable, will continue to monitor  R transgluteal area was inspected. Gauze securely taped around tubing. Small amount of serosanguinous fluid on gauze pad. A smart amount of cloudy, purulent, yellow fluid present in tubing as well bulb suction. Drain was flushed with 5cc normal saline by myself today.  Assessment/Plan: Perforated appendicitis with intra-abdominal pelvic abscesses s/p transgluteal drain placement on 07/12/2024 with Dr. Jennefer.  - Continue drain with positive suction, daily flushing with normal saline and gauze pad changes as needed.  - Follow up CT when output is minimal.   All patient's questions answered.

## 2024-07-13 NOTE — Progress Notes (Addendum)
 PHARMACY - ANTICOAGULATION CONSULT NOTE  Pharmacy Consult for heparin   Indication: atrial fibrillation  Allergies[1]  Patient Measurements: Height: 5' 10 (177.8 cm) Weight: 84.6 kg (186 lb 8.2 oz) IBW/kg (Calculated) : 73 HEPARIN  DW (KG): 84.6  Vital Signs: Temp: 98.5 F (36.9 C) (01/14 2152) Temp Source: Oral (01/14 2152) BP: 138/64 (01/14 2152) Pulse Rate: 78 (01/14 2152)  Labs: Recent Labs    07/11/24 1521 07/12/24 0642 07/12/24 1130 07/13/24 0029 07/13/24 1050 07/13/24 2107  HGB 17.0 15.4  --  14.8  --   --   HCT 49.2 46.0  --  42.6  --   --   PLT 344 269  --  306  --   --   LABPROT  --   --  15.1  --   --   --   INR  --   --  1.1  --   --   --   HEPARINUNFRC  --   --   --  <0.10* 0.25* 0.60  CREATININE 1.41* 1.29*  --  1.22  --   --     Estimated Creatinine Clearance: 54.8 mL/min (by C-G formula based on SCr of 1.22 mg/dL).  Assessment: Patient is a [name] who presented to the ED on 07/11/24 with c/o abdominal pain.  He was found to be in afib and abdominal CT showed perforated appendicitis with multiple pelvic abscesses. CCS recom.  perc drains for abscess.  Pharmacy has been consulted to dose heparin  for afib. Significant Events: 1/13: Heparin  held at 5a for IR drain placement and resume post-procedure ~4p   Today, 07/13/2024: Heparin  level at 2107= 0.6--therapeutic on heparin  1700 units/hr CBC stable Per RN, NGT suction continues to be coffee-ground color (no change), no other s/sx of bleeding noted.   Goal of Therapy:  Heparin  level 0.3-0.7 units/ml Monitor platelets by anticoagulation protocol: Yes   Plan:  Continue heparin  infusion at 1700 units/hr Check confirmatory heparin  level with morning labs Daily CBC; daily heparin  level when stable Monitor for s/sx bleeding or thrombosis F/u plans for long-term anticoagulation; Pharmacy to provide education/coupons as needed (Rx patient advocate to provide DOAC copays in chart) Per surgery 1/14, cont  IV heparin  for now until we are more sure he won't need surgery or further IR intervention   Rosaline Millet, PharmD, BCPS 1/14/202610:58 PM  Addendum: Confirmatory heparin  level = 0.62.  CBC remains WNL.  No new issues reported by RN Continue heparin  1700 units/hr  Rosaline Millet, PharmD, BCPS 07/14/2024@6 :33 AM     [1]  Allergies Allergen Reactions   Losartan     Other Reaction(s): tachycardia, hypotension at 50 mg dose

## 2024-07-13 NOTE — Progress Notes (Signed)
 PHARMACY - PHYSICIAN COMMUNICATION CRITICAL VALUE ALERT - BLOOD CULTURE IDENTIFICATION (BCID)  Luke Dalton is an 74 y.o. male who presented to [facility] on 07/11/2024 with a chief complaint of abdominal pain for past week.   Assessment:  Perforated appendicitis with abdominal and pelvic abscesses per CT scan.  S/p CT guided right transgluteal drain placement yesterday.   Blood cx (anaerobic bottle of 1 set only) + staph species per BCID. VSS.  Name of physician (or Provider) Contacted: DELENA Lever, NP  Current antibiotics: Zosyn   Changes to prescribed antibiotics recommended:  Continue current antibiotics- cx results are likely contaminant.   Results for orders placed or performed during the hospital encounter of 07/11/24  Blood Culture ID Panel (Reflexed) (Collected: 07/11/2024  6:35 PM)  Result Value Ref Range   Enterococcus faecalis NOT DETECTED NOT DETECTED   Enterococcus Faecium NOT DETECTED NOT DETECTED   Listeria monocytogenes NOT DETECTED NOT DETECTED   Staphylococcus species DETECTED (A) NOT DETECTED   Staphylococcus aureus (BCID) NOT DETECTED NOT DETECTED   Staphylococcus epidermidis NOT DETECTED NOT DETECTED   Staphylococcus lugdunensis NOT DETECTED NOT DETECTED   Streptococcus species NOT DETECTED NOT DETECTED   Streptococcus agalactiae NOT DETECTED NOT DETECTED   Streptococcus pneumoniae NOT DETECTED NOT DETECTED   Streptococcus pyogenes NOT DETECTED NOT DETECTED   A.calcoaceticus-baumannii NOT DETECTED NOT DETECTED   Bacteroides fragilis NOT DETECTED NOT DETECTED   Enterobacterales NOT DETECTED NOT DETECTED   Enterobacter cloacae complex NOT DETECTED NOT DETECTED   Escherichia coli NOT DETECTED NOT DETECTED   Klebsiella aerogenes NOT DETECTED NOT DETECTED   Klebsiella oxytoca NOT DETECTED NOT DETECTED   Klebsiella pneumoniae NOT DETECTED NOT DETECTED   Proteus species NOT DETECTED NOT DETECTED   Salmonella species NOT DETECTED NOT DETECTED   Serratia marcescens  NOT DETECTED NOT DETECTED   Haemophilus influenzae NOT DETECTED NOT DETECTED   Neisseria meningitidis NOT DETECTED NOT DETECTED   Pseudomonas aeruginosa NOT DETECTED NOT DETECTED   Stenotrophomonas maltophilia NOT DETECTED NOT DETECTED   Candida albicans NOT DETECTED NOT DETECTED   Candida auris NOT DETECTED NOT DETECTED   Candida glabrata NOT DETECTED NOT DETECTED   Candida krusei NOT DETECTED NOT DETECTED   Candida parapsilosis NOT DETECTED NOT DETECTED   Candida tropicalis NOT DETECTED NOT DETECTED   Cryptococcus neoformans/gattii NOT DETECTED NOT DETECTED    Michelle Nyimah Shadduck 07/13/2024  4:18 AM

## 2024-07-13 NOTE — Progress Notes (Signed)
 "      Subjective: Feels much better today.  Started passing some flatus overnight.  NGT with 275cc out today, currently clamped for meds that were given.  Abdominal pain improved after drain placement.  ROS: See above, otherwise other systems negative  Objective: Vital signs in last 24 hours: Temp:  [98.2 F (36.8 C)-98.4 F (36.9 C)] 98.4 F (36.9 C) (01/14 0527) Pulse Rate:  [83-102] 90 (01/14 0527) Resp:  [18-31] 18 (01/14 0527) BP: (106-153)/(64-117) 112/67 (01/14 0527) SpO2:  [94 %-99 %] 97 % (01/14 0527) Last BM Date : 07/07/24  Intake/Output from previous day: 01/13 0701 - 01/14 0700 In: 513.2 [I.V.:296.1; IV Piggyback:207.1] Out: 690 [Urine:600; Drains:90] Intake/Output this shift: Total I/O In: -  Out: 625 [Urine:350; Emesis/NG output:275]  PE: Gen: NAD Abd: softer today, NGT in place with cannister just changed, IR drain transgluteal with thick purulent drainage, 90cc yesterday.  Minimally tender to palpation today  Lab Results:  Recent Labs    07/12/24 0642 07/13/24 0029  WBC 14.1* 11.8*  HGB 15.4 14.8  HCT 46.0 42.6  PLT 269 306   BMET Recent Labs    07/12/24 0642 07/13/24 0029  NA 136 138  K 3.9 3.3*  CL 98 102  CO2 24 23  GLUCOSE 120* 100*  BUN 34* 34*  CREATININE 1.29* 1.22  CALCIUM 9.3 9.2   PT/INR Recent Labs    07/12/24 1130  LABPROT 15.1  INR 1.1   CMP     Component Value Date/Time   NA 138 07/13/2024 0029   K 3.3 (L) 07/13/2024 0029   CL 102 07/13/2024 0029   CO2 23 07/13/2024 0029   GLUCOSE 100 (H) 07/13/2024 0029   BUN 34 (H) 07/13/2024 0029   CREATININE 1.22 07/13/2024 0029   CALCIUM 9.2 07/13/2024 0029   PROT 6.6 07/13/2024 0029   ALBUMIN 3.2 (L) 07/13/2024 0029   AST 49 (H) 07/13/2024 0029   ALT 92 (H) 07/13/2024 0029   ALKPHOS 85 07/13/2024 0029   BILITOT 1.4 (H) 07/13/2024 0029   GFRNONAA >60 07/13/2024 0029   Lipase     Component Value Date/Time   LIPASE 17 07/11/2024 1521        Studies/Results: ECHOCARDIOGRAM COMPLETE Result Date: 07/13/2024    ECHOCARDIOGRAM REPORT   Patient Name:   LUI BELLIS Date of Exam: 07/12/2024 Medical Rec #:  [id number]        Height:       70.0 in Accession #:    [id number]       Weight:       186.5 lb Date of Birth:  1950/11/30        BSA:          2.026 m Patient Age:    73 years         BP:           113/66 mmHg Patient Gender: M                HR:           95 bpm. Exam Location:  Inpatient Procedure: 2D Echo, Cardiac Doppler and Color Doppler (Both Spectral and Color            Flow Doppler were utilized during procedure). Indications:     Abnormal ECG 194.31  History:         Patient has no prior history of Echocardiogram examinations.  Risk Factors:Hypertension.  Sonographer:     Merlynn Argyle Referring Phys:  [id number] SUMAYYA AMIN Diagnosing Phys: Darryle Decent MD  Sonographer Comments: Image acquisition challenging due to respiratory motion. IMPRESSIONS  1. Left ventricular ejection fraction, by estimation, is 60 to 65%. The left ventricle has normal function. The left ventricle has no regional wall motion abnormalities. Indeterminate diastolic filling due to E-A fusion.  2. Right ventricular systolic function is normal. The right ventricular size is normal. There is normal pulmonary artery systolic pressure. The estimated right ventricular systolic pressure is 22.4 mmHg.  3. The mitral valve is grossly normal. Trivial mitral valve regurgitation. No evidence of mitral stenosis.  4. The aortic valve is tricuspid. Aortic valve regurgitation is mild. No aortic stenosis is present.  5. The inferior vena cava is normal in size with greater than 50% respiratory variability, suggesting right atrial pressure of 3 mmHg. FINDINGS  Left Ventricle: Left ventricular ejection fraction, by estimation, is 60 to 65%. The left ventricle has normal function. The left ventricle has no regional wall motion abnormalities. The left ventricular  internal cavity size was normal in size. There is  no left ventricular hypertrophy. Indeterminate diastolic filling due to E-A fusion. Right Ventricle: The right ventricular size is normal. No increase in right ventricular wall thickness. Right ventricular systolic function is normal. There is normal pulmonary artery systolic pressure. The tricuspid regurgitant velocity is 2.20 m/s, and  with an assumed right atrial pressure of 3 mmHg, the estimated right ventricular systolic pressure is 22.4 mmHg. Left Atrium: Left atrial size was normal in size. Right Atrium: Right atrial size was normal in size. Pericardium: Trivial pericardial effusion is present. Mitral Valve: The mitral valve is grossly normal. Trivial mitral valve regurgitation. No evidence of mitral valve stenosis. Tricuspid Valve: The tricuspid valve is grossly normal. Tricuspid valve regurgitation is trivial. No evidence of tricuspid stenosis. Aortic Valve: The aortic valve is tricuspid. Aortic valve regurgitation is mild. No aortic stenosis is present. Pulmonic Valve: The pulmonic valve was grossly normal. Pulmonic valve regurgitation is trivial. No evidence of pulmonic stenosis. Aorta: The aortic root and ascending aorta are structurally normal, with no evidence of dilitation. Venous: The inferior vena cava is normal in size with greater than 50% respiratory variability, suggesting right atrial pressure of 3 mmHg. IAS/Shunts: The atrial septum is grossly normal.  LEFT VENTRICLE PLAX 2D LVIDd:         4.00 cm LVIDs:         3.10 cm LV PW:         1.00 cm LV IVS:        1.10 cm  RIGHT VENTRICLE          IVC RV Basal diam:  3.60 cm  IVC diam: 1.00 cm TAPSE (M-mode): 2.6 cm LEFT ATRIUM             Index        RIGHT ATRIUM           Index LA diam:        4.30 cm 2.12 cm/m   RA Area:     14.80 cm LA Vol (A2C):   35.4 ml 17.47 ml/m  RA Volume:   29.80 ml  14.71 ml/m LA Vol (A4C):   33.2 ml 16.38 ml/m LA Biplane Vol: 35.0 ml 17.27 ml/m  AORTIC VALVE              PULMONIC VALVE LVOT Vmax:   64.00 cm/s  PR End Diast  Vel: 6.15 msec LVOT Vmean:  46.200 cm/s LVOT VTI:    0.117 m  AORTA Ao Root diam: 3.50 cm Ao Asc diam:  3.80 cm TRICUSPID VALVE TR Peak grad:   19.4 mmHg TR Vmax:        220.00 cm/s  SHUNTS Systemic VTI: 0.12 m Darryle Decent MD Electronically signed by Darryle Decent MD Signature Date/Time: 07/12/2024/2:09:26 PM    Final (Updated)    CT GUIDED PERITONEAL/RETROPERITONEAL FLUID DRAIN BY PERC CATH Result Date: 07/12/2024 INDICATION: 74 year old male with history of perforated appendicitis complicated by pelvic abscess formation. EXAM: CT PERC DRAIN PERITONEAL ABCESS COMPARISON:  07/11/2024 MEDICATIONS: The patient is currently admitted to the hospital and receiving intravenous antibiotics. The antibiotics were administered within an appropriate time frame prior to the initiation of the procedure. ANESTHESIA/SEDATION: Moderate (conscious) sedation was employed during this procedure. A total of Versed  1 mg and Fentanyl  50 mcg was administered intravenously. Moderate Sedation Time: 13 minutes. The patient's level of consciousness and vital signs were monitored continuously by radiology nursing throughout the procedure under my direct supervision. CONTRAST:  None COMPLICATIONS: None immediate. PROCEDURE: RADIATION DOSE REDUCTION: This exam was performed according to the departmental dose-optimization program which includes automated exposure control, adjustment of the mA and/or kV according to patient size and/or use of iterative reconstruction technique. Informed written consent was obtained from the patient after a discussion of the risks, benefits and alternatives to treatment. The patient was placed prone on the CT gantry and a pre procedural CT was performed re-demonstrating the known abscess/fluid collection within the pelvis. The procedure was planned. A timeout was performed prior to the initiation of the procedure. The right trans gluteal region was  prepped and draped in the usual sterile fashion. The overlying soft tissues were anesthetized with 1% lidocaine  with epinephrine. Appropriate trajectory was planned with the use of a 22 gauge spinal needle. An 18 gauge trocar needle was advanced into the abscess/fluid collection and a short Amplatz super stiff wire was coiled within the collection. Appropriate positioning was confirmed with a limited CT scan. The tract was serially dilated allowing placement of a 10 French all-purpose drainage catheter. Appropriate positioning was confirmed with a limited postprocedural CT scan. Approximately 100 ml of purulent fluid was aspirated. The tube was connected to a bulb suction and sutured in place. A dressing was placed. The patient tolerated the procedure well without immediate post procedural complication. IMPRESSION: Successful CT guided placement of a right transgluteal 10 French all purpose drain catheter into the pelvic abscess with aspiration of 100 mL of purulent fluid. Samples were sent to the laboratory as requested by the ordering clinical team. Ester Sides, MD [facility] Radiology Electronically Signed   By: Ester Sides M.D.   On: 07/12/2024 15:23   DG Abd Portable 1V Result Date: 07/12/2024 CLINICAL DATA:  Nasogastric tube placement. EXAM: PORTABLE ABDOMEN - 1 VIEW COMPARISON:  Yesterday FINDINGS: Nasogastric tube tip is seen in proximal stomach. Small bowel dilatation is noted concerning for distal small bowel obstruction. IMPRESSION: Nasogastric tube tip seen in proximal stomach. Small bowel dilatation is noted concerning for distal small bowel obstruction. Electronically Signed   By: Lynwood Landy Raddle M.D.   On: 07/12/2024 12:46   CT ABDOMEN PELVIS W CONTRAST Result Date: 07/11/2024 CLINICAL DATA:  Right lower quadrant abdominal pain. EXAM: CT ABDOMEN AND PELVIS WITH CONTRAST TECHNIQUE: Multidetector CT imaging of the abdomen and pelvis was  performed using the standard protocol following bolus administration  of intravenous contrast. RADIATION DOSE REDUCTION: This exam was performed according to the departmental dose-optimization program which includes automated exposure control, adjustment of the mA and/or kV according to patient size and/or use of iterative reconstruction technique. CONTRAST:  OMNIPAQUE  IOHEXOL  300 MG/ML  SOLN COMPARISON:  None Available. FINDINGS: Lower chest: The lung bases demonstrate streaky bibasilar atelectasis or scarring changes. No infiltrates or effusions. No pericardial effusion. Hepatobiliary: No focal liver abnormality is seen. No gallstones, gallbladder wall thickening, or biliary dilatation. Pancreas: Unremarkable. No pancreatic ductal dilatation or surrounding inflammatory changes. Spleen: Normal in size without focal abnormality. Adrenals/Urinary Tract: The adrenal glands and kidneys are normal. Benign bilateral parapelvic renal cysts not requiring any further imaging evaluation or follow-up. The bladder is unremarkable. Stomach/Bowel: The stomach is unremarkable. The duodenum is normal. Dilated proximal and mid small bowel loops with air-fluid levels. Transition to decompressed and inflamed appearing mid distal ileal loops of small bowel. The colon is unremarkable. No colonic lesions. Advanced sigmoid colon diverticulosis. Extensive inflammatory process in the right lower quadrant and upper right pelvis. CT findings consistent with perforated appendicitis with abdominal and pelvic abscesses. The largest right lower quadrant abscess measures 6.4 x 3.4 cm and contains a small amount non dependent gas. The pelvic abscess is between the bladder and the rectum and measures approximately 7.3 x 5.0 cm. Multiple other smaller likely communicating abscesses in the right lower quadrant and pelvis. Vascular/Lymphatic: Atherosclerotic calcifications involving the aorta and iliac arteries but no aneurysm. The branch vessels  are patent. The major venous structures are patent. Small mesenteric and retroperitoneal lymph nodes, likely reactive. No pelvic adenopathy. Reproductive: The prostate gland and seminal vesicles are unremarkable. Bilateral hydroceles are noted. Other: No free air is identified but small locules gas are noted in the abscesses. Musculoskeletal: No significant bony findings. Moderate degenerative changes involving the spine. IMPRESSION: 1. CT findings consistent with perforated appendicitis with abdominal and pelvic abscesses. Recommend surgery consultation. 2. Associated small bowel obstruction (likely functional as opposed to mechanical). Inflamed enhancing right lower quadrant 3. Advanced sigmoid colon diverticulosis. 4. Aortic atherosclerosis. Aortic Atherosclerosis (ICD10-I70.0). Electronically Signed   By: MYRTIS Stammer M.D.   On: 07/11/2024 17:56    Anti-infectives: Anti-infectives (From admission, onward)    Start     Dose/Rate Route Frequency Ordered Stop   07/12/24 0300  piperacillin -tazobactam (ZOSYN ) IVPB 3.375 g  Status:  Discontinued        3.375 g 12.5 mL/hr over 240 Minutes Intravenous Every 8 hours 07/11/24 1842 07/11/24 1843   07/12/24 0100  piperacillin -tazobactam (ZOSYN ) IVPB 3.375 g        3.375 g 12.5 mL/hr over 240 Minutes Intravenous Every 8 hours 07/11/24 1843     07/11/24 1815  piperacillin -tazobactam (ZOSYN ) IVPB 3.375 g        3.375 g 100 mL/hr over 30 Minutes Intravenous  Once 07/11/24 1814 07/11/24 1946        Assessment/Plan Acute perforated appendicitis with multiple abscesses and reactive SBO -patient feels better today and WBC down to 11.8.  AF -will check a plain film to compare SB dilatation today to yesterday.  He is having some flatus, but was so dilated yesterday that I'm hesitant to remove his NGT today already -cont IV abx therapy -cont IR drain -attempt to get him better with no surgery this admission if he continues to improve with conservative  management.  Would likely plan for interval appy in 6-8 weeks -cont IV heparin  for now until we are more sure  he won't need surgery or further IR intervention   FEN - NPO/NGT/IVFs VTE - heparin  gtt ID - zosyn   New onset a fib HTN  I reviewed Consultant cardiology notes, hospitalist notes, last 24 h vitals and pain scores, last 48 h intake and output, last 24 h labs and trends, and last 24 h imaging results.   LOS: 2 days    Burnard FORBES Banter , [facility] Surgery 07/13/2024, 9:34 AM Please see Amion for pager number during day hours 7:00am-4:30pm or 7:00am -11:30am on weekends  "

## 2024-07-13 NOTE — Progress Notes (Addendum)
 PHARMACY - ANTICOAGULATION CONSULT NOTE  Pharmacy Consult for heparin   Indication: atrial fibrillation  Allergies[1]  Patient Measurements: Height: 5' 10 (177.8 cm) Weight: 84.6 kg (186 lb 8.2 oz) IBW/kg (Calculated) : 73 HEPARIN  DW (KG): 84.6  Vital Signs: Temp: 98.4 F (36.9 C) (01/14 0527) Temp Source: Oral (01/14 0527) BP: 112/67 (01/14 0527) Pulse Rate: 90 (01/14 0527)  Labs: Recent Labs    07/11/24 1521 07/12/24 0642 07/12/24 1130 07/13/24 0029 07/13/24 1050  HGB 17.0 15.4  --  14.8  --   HCT 49.2 46.0  --  42.6  --   PLT 344 269  --  306  --   LABPROT  --   --  15.1  --   --   INR  --   --  1.1  --   --   HEPARINUNFRC  --   --   --  <0.10* 0.25*  CREATININE 1.41* 1.29*  --  1.22  --     Estimated Creatinine Clearance: 54.8 mL/min (by C-G formula based on SCr of 1.22 mg/dL).  Assessment: Patient is a [name] who presented to the ED on 07/11/24 with c/o abdominal pain.  He was found to be in afib and abdominal CT showed perforated appendicitis with multiple pelvic abscesses. CCS recom.  perc drains for abscess.  Pharmacy has been consulted to dose heparin  for afib. Significant Events: 1/13: Heparin  held at 5a for IR drain placement and resume post-procedure ~4p   Today, 07/13/2024: Heparin  level 0.25--subtherapeutic on heparin  1500 units/hr CBC stable Scr continues to improve; now WNL Per RN, NGT suction showing coffee-ground color, no other s/sx of bleeding noted. MD notified, okay to continue heparin  for now.   Goal of Therapy:  Heparin  level 0.3-0.7 units/ml Monitor platelets by anticoagulation protocol: Yes   Plan:  Increase heparin  infusion to 1700 units/hr Check heparin  level 8 hrs after rate increase Daily CBC; daily heparin  level when stable Monitor for s/sx bleeding or thrombosis F/u plans for long-term anticoagulation; Pharmacy to provide education/coupons as needed (Rx patient advocate to provide DOAC copays in chart) Per surgery 1/14,  cont IV heparin  for now until we are more sure he won't need surgery or further IR intervention   Lacinda Moats, PharmD Clinical Pharmacist  1/14/202612:32 PM     [1]  Allergies Allergen Reactions   Losartan     Other Reaction(s): tachycardia, hypotension at 50 mg dose

## 2024-07-13 NOTE — Progress Notes (Signed)
 PHARMACY - ANTICOAGULATION CONSULT NOTE  Pharmacy Consult for heparin   Indication: atrial fibrillation  Allergies[1]  Patient Measurements: Height: 5' 10 (177.8 cm) Weight: 84.6 kg (186 lb 8.2 oz) IBW/kg (Calculated) : 73 HEPARIN  DW (KG): 84.6  Vital Signs: Temp: 98.4 F (36.9 C) (01/14 0038) Temp Source: Oral (01/14 0038) BP: 120/69 (01/14 0038) Pulse Rate: 87 (01/14 0038)  Labs: Recent Labs    07/11/24 1521 07/12/24 0642 07/12/24 1130 07/13/24 0029  HGB 17.0 15.4  --  14.8  HCT 49.2 46.0  --  42.6  PLT 344 269  --  306  LABPROT  --   --  15.1  --   INR  --   --  1.1  --   HEPARINUNFRC  --   --   --  <0.10*  CREATININE 1.41* 1.29*  --  1.22    Estimated Creatinine Clearance: 54.8 mL/min (by C-G formula based on SCr of 1.22 mg/dL).  Assessment: Patient is a [name] who presented to the ED on 07/11/24 with c/o abdominal pain.  He was found to be in afib and abdominal CT showed perforated appendicitis with multiple pelvic abscesses. CCS recom.  perc drains for abscess.  Pharmacy has been consulted to dose heparin  for afib. Significant Events: 1/13: Heparin  held at 5a for IR drain placement and resume post-procedure ~4p   Today, 07/13/2024: Initial heparin  level <0.1; subtherapeutic on IV heparin  1200 units/hr CBC: Hg, plts remain WNL Scr continues to improve; now WNL No bleeding or infusion related concerns reported by RN  Goal of Therapy:  Heparin  level 0.3-0.7 units/ml Monitor platelets by anticoagulation protocol: Yes   Plan:  Increase heparin  infusion to 1500 units/hr; no bolus Check heparin  level 8 hr after rate increase Daily CBC; daily heparin  level when stable Monitor for s/s bleeding or thrombosis F/u plans for long-term anticoagulation; Pharmacy to provide education/coupons as needed (Rx patient advocate to provide DOAC copays in chart)  Rosaline Millet, PharmD, BCPS 07/13/2024, 2:26 AM     [1]  Allergies Allergen Reactions   Losartan      Other Reaction(s): tachycardia, hypotension at 50 mg dose

## 2024-07-13 NOTE — Progress Notes (Signed)
 " [facility] Progress Note  Patient: Luke Dalton     FMW:[id number]  DOA: 07/11/2024   PCP: Chrystal Lamarr RAMAN, MD       Brief hospital course: 74 y.o. male with PMH of f hypertension, dyslipidemia, BPH and spinal stenosis s/p of steroid injection a week ago came to ED abdominal pain RLQ sicne Wednesday 07/06/24 with associated nausea, some diarrhea.  The patient states that the pain was on and off on Wednesday and seem to improve with heat and ice.  However, on the following day the pain  became severe and constant.  He was following up with his neurosurgeon on Monday who evaluated him and recommended that he go to the emergency department to have his appendix assessed.  In ED A-fib/flutter (new) WBC 20.2 Cr 1.41 Bicarb 21 with AG of 18 Mildly elevated LFTs U sp gravity> 1.046  CT a/p Perforated appendix with abd/ pelvic abscesses, some SBO 1/13- transgluteal drain by IR- 150 cc of purulent fluid  Subjective:  Patient has a sore throat but otherwise has no complaints.  Assessment and Plan: Principal Problem:   Perforated appendix resulting in multiple intra-abdominal abscesses, reactive SBO - general surgery managing -  cont drain -has NG tube which is draining dark brown-colored fluid on my eval - appendectomy planned for a later date - colonoscopy in 6-8 wks to r/o malignancy - WBC 20.2> 11.8 - Gr stain : abundant PMN, Gr variable rods, gram neg rods - blood culture 1/4 bottle gm + cocci, BCID staph - Continue LR and Zosyn    Active Problems:     New onset atrial flutter - heparin  infusion - Metoprolol  50 mg BID - 2D echo reveals an EF of 60 to 65%, indeterminant diastolic filling pressure due to E-A fusion - Patient was asymptomatic - Appreciate cardiology eval  Hypokalemia - Replace via IV and recheck tomorrow  Elevated LFTs - Possibly secondary to perforated appendix - Continue to follow - No old labs to compare with.    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  OA b/l knees - Cymbalta  and PT - follows with neurology and ortho    Code Status: Full Code Total time on patient care: 40 minutes DVT prophylaxis:  Heparin  infusion     Objective:   Vitals:   07/12/24 1700 07/12/24 2131 07/13/24 0038 07/13/24 0527  BP: (!) 140/69 119/75 120/69 112/67  Pulse: (!) 101 99 87 90  Resp: 18 19 19 18   Temp: 98.2 F (36.8 C) 98.2 F (36.8 C) 98.4 F (36.9 C) 98.4 F (36.9 C)  TempSrc: Oral Oral Oral Oral  SpO2: 97% 98% 98% 97%  Weight:      Height: 5' 10 (1.778 m)      Filed Weights   07/11/24 1500 07/11/24 1651 07/12/24 0721  Weight: 79.8 kg 84.6 kg 84.6 kg   Exam: General exam: Appears comfortable  HEENT: oral mucosa moist-NG tube present Respiratory system: Clear to auscultation.  Cardiovascular system: S1 & S2 heard-irregularly irregular rate and rhythm Gastrointestinal system: Abdomen soft, non-tender, nondistended. Normal bowel sounds-drain noted Extremities: No cyanosis, clubbing or edema Psychiatry:  Mood & affect appropriate.      CBC: Recent Labs  Lab 07/11/24 1521 07/12/24 0642 07/13/24 0029  WBC 20.2* 14.1* 11.8*  NEUTROABS 17.7*  --   --   HGB 17.0 15.4 14.8  HCT 49.2 46.0 42.6  MCV 94.4 97.7 95.5  PLT 344 269 306   Basic Metabolic Panel: Recent Labs  Lab 07/11/24 1521 07/12/24 0642 07/13/24 0029  NA 137 136 138  K 3.5 3.9 3.3*  CL 98 98 102  CO2 21* 24 23  GLUCOSE 152* 120* 100*  BUN 38* 34* 34*  CREATININE 1.41* 1.29* 1.22  CALCIUM 10.4* 9.3 9.2     Scheduled Meds:  DULoxetine   30 mg Oral BID   metoprolol  tartrate  50 mg Oral BID   montelukast   10 mg Oral [id number]   sodium chloride  flush  3 mL Intravenous Q12H   sodium chloride  flush  5 mL Intracatheter Q8H   tamsulosin   0.4 mg Oral Daily    Imaging and  lab data personally reviewed   Author: Chizuko Trine  07/13/2024 8:49 AM  To contact [facility]>   Check the care team in [facility] and look for the attending/consulting [facility] provider listed  Log into [email or web address] and use [facility] universal password   Go to> [facility]  and find provider  If you still have difficulty reaching the provider, please page the [facility] (Director on Call) for the Hospitalists listed on amion     "

## 2024-07-14 DIAGNOSIS — Z7901 Long term (current) use of anticoagulants: Secondary | ICD-10-CM | POA: Diagnosis not present

## 2024-07-14 DIAGNOSIS — I480 Paroxysmal atrial fibrillation: Secondary | ICD-10-CM

## 2024-07-14 DIAGNOSIS — Z5181 Encounter for therapeutic drug level monitoring: Secondary | ICD-10-CM

## 2024-07-14 DIAGNOSIS — K3532 Acute appendicitis with perforation and localized peritonitis, without abscess: Secondary | ICD-10-CM | POA: Diagnosis not present

## 2024-07-14 LAB — COMPREHENSIVE METABOLIC PANEL WITH GFR
ALT: 60 U/L — ABNORMAL HIGH (ref 0–44)
ALT: 55 U/L — ABNORMAL HIGH (ref 0–44)
AST: 31 U/L (ref 15–41)
AST: 31 U/L (ref 15–41)
Albumin: 3.2 g/dL — ABNORMAL LOW (ref 3.5–5.0)
Albumin: 3.1 g/dL — ABNORMAL LOW (ref 3.5–5.0)
Alkaline Phosphatase: 74 U/L (ref 38–126)
Alkaline Phosphatase: 79 U/L (ref 38–126)
Anion gap: 14 (ref 5–15)
Anion gap: 13 (ref 5–15)
BUN: 27 mg/dL — ABNORMAL HIGH (ref 8–23)
BUN: 24 mg/dL — ABNORMAL HIGH (ref 8–23)
CO2: 22 mmol/L (ref 22–32)
CO2: 23 mmol/L (ref 22–32)
Calcium: 9.1 mg/dL (ref 8.9–10.3)
Calcium: 8.8 mg/dL — ABNORMAL LOW (ref 8.9–10.3)
Chloride: 107 mmol/L (ref 98–111)
Chloride: 107 mmol/L (ref 98–111)
Creatinine, Ser: 1.12 mg/dL (ref 0.61–1.24)
Creatinine, Ser: 1.03 mg/dL (ref 0.61–1.24)
GFR, Estimated: >60 mL/min
GFR, Estimated: >60 mL/min
Glucose, Bld: 93 mg/dL (ref 70–99)
Glucose, Bld: 89 mg/dL (ref 70–99)
Potassium: 3.9 mmol/L (ref 3.5–5.1)
Potassium: 3.8 mmol/L (ref 3.5–5.1)
Sodium: 142 mmol/L (ref 135–145)
Sodium: 143 mmol/L (ref 135–145)
Total Bilirubin: 1.1 mg/dL (ref 0.0–1.2)
Total Bilirubin: 1.1 mg/dL (ref 0.0–1.2)
Total Protein: 6.4 g/dL — ABNORMAL LOW (ref 6.5–8.1)
Total Protein: 6.1 g/dL — ABNORMAL LOW (ref 6.5–8.1)

## 2024-07-14 LAB — BLOOD CULTURE ID PANEL (REFLEXED) - BCID2

## 2024-07-14 LAB — CBC
HCT: 42.1 % (ref 39.0–52.0)
Hemoglobin: 14 g/dL (ref 13.0–17.0)
MCH: 33 pg (ref 26.0–34.0)
MCHC: 33.3 g/dL (ref 30.0–36.0)
MCV: 99.3 fL (ref 80.0–100.0)
Platelets: 330 K/uL (ref 150–400)
RBC: 4.24 MIL/uL (ref 4.22–5.81)
RDW: 13.2 % (ref 11.5–15.5)
WBC: 11.1 K/uL — ABNORMAL HIGH (ref 4.0–10.5)
nRBC: 0 % (ref 0.0–0.2)

## 2024-07-14 LAB — HEPARIN LEVEL (UNFRACTIONATED): Heparin Unfractionated: 0.62 [IU]/mL (ref 0.30–0.70)

## 2024-07-14 MED ORDER — PANTOPRAZOLE SODIUM 40 MG IV SOLR
40.0000 mg | INTRAVENOUS | Status: DC
  Administered 2024-07-14 – 2024-07-19 (×6): 40 mg via INTRAVENOUS
  Filled 2024-07-14 (×6): qty 10

## 2024-07-14 NOTE — Progress Notes (Signed)
 " [facility] Progress Note  Patient: Luke Dalton     FMW:[id number]  DOA: 07/11/2024   PCP: Chrystal Lamarr RAMAN, MD       Brief hospital course: 74 y.o. male with PMH of f hypertension, dyslipidemia, BPH and spinal stenosis s/p of steroid injection a week ago came to ED abdominal pain RLQ sicne Wednesday 07/06/24 with associated nausea, some diarrhea.  The patient states that the pain was on and off on Wednesday and seem to improve with heat and ice.  However, on the following day the pain  became severe and constant.  He was following up with his neurosurgeon on Monday who evaluated him and recommended that he go to the emergency department to have his appendix assessed.  In ED A-fib/flutter (new) WBC 20.2 Cr 1.41 Bicarb 21 with AG of 18 Mildly elevated LFTs U sp gravity> 1.046  CT a/p Perforated appendix with abd/ pelvic abscesses, some SBO 1/13- transgluteal drain by IR- 150 cc of purulent fluid  Subjective:   Drowsy this AM when I evaluated him. He had no complaints.   Assessment and Plan: Principal Problem:   Perforated appendix resulting in multiple intra-abdominal abscesses, reactive SBO - general surgery managing -  cont drain -has NG tube which is draining dark brown-colored fluid on my eval - appendectomy planned for a later date - colonoscopy in 6-8 wks to r/o malignancy - WBC 20.2> 11.8 - Gr stain : abundant PMN, Gr variable rods, gram neg rods - Continue LR and Zosyn  - NG to be clamped today per general surgery   Active Problems:  Bacteremia with bacteroid fragilis - present in both sets of blood cultures - cont Zosyn   1/4 bottles growing gram + cocci- BCID > staph-      New onset atrial flutter - heparin  infusion - Metoprolol  50 mg BID - 2D echo reveals an EF of 60 to 65%, indeterminant diastolic filling pressure due to E-A fusion - Patient was asymptomatic - Appreciate cardiology eval  Hypokalemia - Replaced  Elevated LFTs -  Possibly secondary to perforated appendix - are improving - No old labs to compare with.    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  OA b/l knees - Cymbalta  and PT - follows with neurology and ortho    Code Status: Full Code Total time on patient care: 40 minutes DVT prophylaxis:  Heparin  infusion     Objective:   Vitals:   07/13/24 0527 07/13/24 1421 07/13/24 2152 07/14/24 0540  BP: 112/67 (!) 123/38 138/64 (!) 148/65  Pulse: 90 80 78 67  Resp: 18 20 18    Temp: 98.4 F (36.9 C) 98.2 F (36.8 C) 98.5 F (36.9 C) 98 F (36.7 C)  TempSrc: Oral Oral Oral Oral  SpO2: 97% 97% 100% 100%  Weight:      Height:       Filed Weights   07/11/24 1500 07/11/24 1651 07/12/24 0721  Weight: 79.8 kg 84.6 kg 84.6 kg   Exam: General exam: Appears comfortable  HEENT: oral mucosa moist-NG tube present Respiratory system: Clear to auscultation.  Cardiovascular system: S1 & S2 heard-irregularly irregular rate and rhythm Gastrointestinal system: Abdomen soft, non-tender, nondistended. Normal bowel sounds-drain noted with milky fluid Extremities: No cyanosis, clubbing or edema Psychiatry:  Mood & affect appropriate.      CBC: Recent Labs  Lab 07/11/24 1521  07/12/24 0642 07/13/24 0029 07/14/24 0419  WBC 20.2* 14.1* 11.8* 11.1*  NEUTROABS 17.7*  --   --   --   HGB 17.0 15.4 14.8 14.0  HCT 49.2 46.0 42.6 42.1  MCV 94.4 97.7 95.5 99.3  PLT 344 269 306 330   Basic Metabolic Panel: Recent Labs  Lab 07/11/24 1521 07/12/24 0642 07/13/24 0029 07/14/24 0419  NA 137 136 138 142  K 3.5 3.9 3.3* 3.9  CL 98 98 102 107  CO2 21* 24 23 22   GLUCOSE 152* 120* 100* 93  BUN 38* 34* 34* 27*  CREATININE 1.41* 1.29* 1.22 1.12  CALCIUM 10.4* 9.3 9.2 9.1     Scheduled Meds:  DULoxetine   30 mg Oral BID   metoprolol   tartrate  50 mg Oral BID   montelukast   10 mg Oral BH-q7a   sodium chloride  flush  3 mL Intravenous Q12H   sodium chloride  flush  5 mL Intracatheter Q8H   tamsulosin   0.4 mg Oral Daily    Imaging and lab data personally reviewed   Author: Eragon Hammond  07/14/2024 7:35 AM  To contact [facility]>   Check the care team in [facility] and look for the attending/consulting [facility] provider listed  Log into [email or web address] and use [facility] universal password   Go to> [facility]  and find provider  If you still have difficulty reaching the provider, please page the [facility] (Director on Call) for the Hospitalists listed on amion     "

## 2024-07-14 NOTE — Progress Notes (Signed)
 "   Subjective:  Denies SSCP, palpitations or Dyspnea He is in NSR He has significant blood in his NG tube  Objective:  Vitals:   07/13/24 1421 07/13/24 2152 07/14/24 0540 07/14/24 0829  BP: (!) 123/38 138/64 (!) 148/65 (!) 149/62  Pulse: 80 78 67 68  Resp: 20 18    Temp: 98.2 F (36.8 C) 98.5 F (36.9 C) 98 F (36.7 C)   TempSrc: Oral Oral Oral   SpO2: 97% 100% 100%   Weight:      Height:        Intake/Output from previous day:  Intake/Output Summary (Last 24 hours) at 07/14/2024 0837 Last data filed at 07/14/2024 9178 Gross per 24 hour  Intake 1621.54 ml  Output 2135 ml  Net -513.46 ml    Physical Exam: Feels miserable NG tube with hematochezia  Lungs clear No murmur Abdomen with right posterior appy drain in No edema  Lab Results: Basic Metabolic Panel: Recent Labs    07/13/24 0029 07/14/24 0419  NA 138 142  K 3.3* 3.9  CL 102 107  CO2 23 22  GLUCOSE 100* 93  BUN 34* 27*  CREATININE 1.22 1.12  CALCIUM 9.2 9.1   Liver Function Tests: Recent Labs    07/13/24 0029 07/14/24 0419  AST 49* 31  ALT 92* 60*  ALKPHOS 85 74  BILITOT 1.4* 1.1  PROT 6.6 6.4*  ALBUMIN 3.2* 3.2*   Recent Labs    07/11/24 1521  LIPASE 17   CBC: Recent Labs    07/11/24 1521 07/12/24 0642 07/13/24 0029 07/14/24 0419  WBC 20.2*   < > 11.8* 11.1*  NEUTROABS 17.7*  --   --   --   HGB 17.0   < > 14.8 14.0  HCT 49.2   < > 42.6 42.1  MCV 94.4   < > 95.5 99.3  PLT 344   < > 306 330   < > = values in this interval not displayed.     Imaging: DG Abd Portable 1V Result Date: 07/13/2024 CLINICAL DATA:  Small bowel obstruction. EXAM: PORTABLE ABDOMEN - 1 VIEW COMPARISON:  Abdominal radiograph dated 07/12/2024. FINDINGS: Partially visualized enteric tube with tip in the proximal stomach and side-port in the region of the GE junction. Recommend further advancing by additional 5 cm. Diffusely dilated small bowel loops measure up to 5 cm in caliber. A pigtail catheter noted  over the pelvis. Degenerative changes of the spine. No acute osseous pathology. IMPRESSION: 1. Persistent small bowel obstruction. 2. Recommend further advancing the enteric tube by additional 5 cm. Electronically Signed   By: Vanetta Chou M.D.   On: 07/13/2024 13:40   ECHOCARDIOGRAM COMPLETE Result Date: 07/13/2024    ECHOCARDIOGRAM REPORT   Patient Name:   Luke Dalton Date of Exam: 07/12/2024 Medical Rec #:  [id number]        Height:       70.0 in Accession #:    [id number]       Weight:       186.5 lb Date of Birth:  05/03/1951        BSA:          2.026 m Patient Age:    73 years         BP:           113/66 mmHg Patient Gender: M                HR:  95 bpm. Exam Location:  Inpatient Procedure: 2D Echo, Cardiac Doppler and Color Doppler (Both Spectral and Color            Flow Doppler were utilized during procedure). Indications:     Abnormal ECG 194.31  History:         Patient has no prior history of Echocardiogram examinations.                  Risk Factors:Hypertension.  Sonographer:     Merlynn Argyle Referring Phys:  [id number] SUMAYYA AMIN Diagnosing Phys: Darryle Decent MD  Sonographer Comments: Image acquisition challenging due to respiratory motion. IMPRESSIONS  1. Left ventricular ejection fraction, by estimation, is 60 to 65%. The left ventricle has normal function. The left ventricle has no regional wall motion abnormalities. Indeterminate diastolic filling due to E-A fusion.  2. Right ventricular systolic function is normal. The right ventricular size is normal. There is normal pulmonary artery systolic pressure. The estimated right ventricular systolic pressure is 22.4 mmHg.  3. The mitral valve is grossly normal. Trivial mitral valve regurgitation. No evidence of mitral stenosis.  4. The aortic valve is tricuspid. Aortic valve regurgitation is mild. No aortic stenosis is present.  5. The inferior vena cava is normal in size with greater than 50% respiratory variability, suggesting  right atrial pressure of 3 mmHg. FINDINGS  Left Ventricle: Left ventricular ejection fraction, by estimation, is 60 to 65%. The left ventricle has normal function. The left ventricle has no regional wall motion abnormalities. The left ventricular internal cavity size was normal in size. There is  no left ventricular hypertrophy. Indeterminate diastolic filling due to E-A fusion. Right Ventricle: The right ventricular size is normal. No increase in right ventricular wall thickness. Right ventricular systolic function is normal. There is normal pulmonary artery systolic pressure. The tricuspid regurgitant velocity is 2.20 m/s, and  with an assumed right atrial pressure of 3 mmHg, the estimated right ventricular systolic pressure is 22.4 mmHg. Left Atrium: Left atrial size was normal in size. Right Atrium: Right atrial size was normal in size. Pericardium: Trivial pericardial effusion is present. Mitral Valve: The mitral valve is grossly normal. Trivial mitral valve regurgitation. No evidence of mitral valve stenosis. Tricuspid Valve: The tricuspid valve is grossly normal. Tricuspid valve regurgitation is trivial. No evidence of tricuspid stenosis. Aortic Valve: The aortic valve is tricuspid. Aortic valve regurgitation is mild. No aortic stenosis is present. Pulmonic Valve: The pulmonic valve was grossly normal. Pulmonic valve regurgitation is trivial. No evidence of pulmonic stenosis. Aorta: The aortic root and ascending aorta are structurally normal, with no evidence of dilitation. Venous: The inferior vena cava is normal in size with greater than 50% respiratory variability, suggesting right atrial pressure of 3 mmHg. IAS/Shunts: The atrial septum is grossly normal.  LEFT VENTRICLE PLAX 2D LVIDd:         4.00 cm LVIDs:         3.10 cm LV PW:         1.00 cm LV IVS:        1.10 cm  RIGHT VENTRICLE          IVC RV Basal diam:  3.60 cm  IVC diam: 1.00 cm TAPSE (M-mode): 2.6 cm LEFT ATRIUM             Index        RIGHT  ATRIUM           Index LA diam:  4.30 cm 2.12 cm/m   RA Area:     14.80 cm LA Vol (A2C):   35.4 ml 17.47 ml/m  RA Volume:   29.80 ml  14.71 ml/m LA Vol (A4C):   33.2 ml 16.38 ml/m LA Biplane Vol: 35.0 ml 17.27 ml/m  AORTIC VALVE             PULMONIC VALVE LVOT Vmax:   64.00 cm/s  PR End Diast Vel: 6.15 msec LVOT Vmean:  46.200 cm/s LVOT VTI:    0.117 m  AORTA Ao Root diam: 3.50 cm Ao Asc diam:  3.80 cm TRICUSPID VALVE TR Peak grad:   19.4 mmHg TR Vmax:        220.00 cm/s  SHUNTS Systemic VTI: 0.12 m Darryle Decent MD Electronically signed by Darryle Decent MD Signature Date/Time: 07/12/2024/2:09:26 PM    Final (Updated)    CT GUIDED PERITONEAL/RETROPERITONEAL FLUID DRAIN BY PERC CATH Result Date: 07/12/2024 INDICATION: 74 year old male with history of perforated appendicitis complicated by pelvic abscess formation. EXAM: CT PERC DRAIN PERITONEAL ABCESS COMPARISON:  07/11/2024 MEDICATIONS: The patient is currently admitted to the hospital and receiving intravenous antibiotics. The antibiotics were administered within an appropriate time frame prior to the initiation of the procedure. ANESTHESIA/SEDATION: Moderate (conscious) sedation was employed during this procedure. A total of Versed  1 mg and Fentanyl  50 mcg was administered intravenously. Moderate Sedation Time: 13 minutes. The patient's level of consciousness and vital signs were monitored continuously by radiology nursing throughout the procedure under my direct supervision. CONTRAST:  None COMPLICATIONS: None immediate. PROCEDURE: RADIATION DOSE REDUCTION: This exam was performed according to the departmental dose-optimization program which includes automated exposure control, adjustment of the mA and/or kV according to patient size and/or use of iterative reconstruction technique. Informed written consent was obtained from the patient after a discussion of the risks, benefits and alternatives to treatment. The patient was placed prone on the CT  gantry and a pre procedural CT was performed re-demonstrating the known abscess/fluid collection within the pelvis. The procedure was planned. A timeout was performed prior to the initiation of the procedure. The right trans gluteal region was prepped and draped in the usual sterile fashion. The overlying soft tissues were anesthetized with 1% lidocaine  with epinephrine. Appropriate trajectory was planned with the use of a 22 gauge spinal needle. An 18 gauge trocar needle was advanced into the abscess/fluid collection and a short Amplatz super stiff wire was coiled within the collection. Appropriate positioning was confirmed with a limited CT scan. The tract was serially dilated allowing placement of a 10 French all-purpose drainage catheter. Appropriate positioning was confirmed with a limited postprocedural CT scan. Approximately 100 ml of purulent fluid was aspirated. The tube was connected to a bulb suction and sutured in place. A dressing was placed. The patient tolerated the procedure well without immediate post procedural complication. IMPRESSION: Successful CT guided placement of a right transgluteal 10 French all purpose drain catheter into the pelvic abscess with aspiration of 100 mL of purulent fluid. Samples were sent to the laboratory as requested by the ordering clinical team. Ester Sides, MD [facility] Electronically Signed   By: Ester Sides M.D.   On: 07/12/2024 15:23   DG Abd Portable 1V Result Date: 07/12/2024 CLINICAL DATA:  Nasogastric tube placement. EXAM: PORTABLE ABDOMEN - 1 VIEW COMPARISON:  Yesterday FINDINGS: Nasogastric tube tip is seen in proximal stomach. Small bowel dilatation is noted concerning for distal small bowel obstruction. IMPRESSION: Nasogastric  tube tip seen in proximal stomach. Small bowel dilatation is noted concerning for distal small bowel obstruction. Electronically Signed   By: Lynwood Landy Raddle M.D.   On:  07/12/2024 12:46    Cardiac Studies:  ECG: afib/flutter rate 104   Telemetry: converted to NSR rate 72   Echo: EF 60-65% normal RV mild AR  Medications:    DULoxetine   30 mg Oral BID   metoprolol  tartrate  50 mg Oral BID   montelukast   10 mg Oral BH-q7a   sodium chloride  flush  3 mL Intravenous Q12H   sodium chloride  flush  5 mL Intracatheter Q8H   tamsulosin   0.4 mg Oral Daily      heparin  1,700 Units/hr (07/14/24 0412)   lactated ringers  100 mL/hr at 07/14/24 9366   piperacillin -tazobactam (ZOSYN )  IV 3.375 g (07/14/24 0823)    Assessment/Plan:    Preoperative:  Echo has normal EF no clinical CAD Prior to admission functional capacity good. Converted to NSR now.  Continue beta blocker On Zosyn  ?  Has drain in. He has significant hematochezia from NG tube D/C heparin  iv protonix  plan per primary service and surgery   Maude Emmer 07/14/2024, 8:37 AM    "

## 2024-07-14 NOTE — Progress Notes (Signed)
 PHARMACY - PHYSICIAN COMMUNICATION CRITICAL VALUE ALERT - BLOOD CULTURE IDENTIFICATION (BCID)  Luke Dalton is an 74 y.o. male who presented to [facility] on 07/11/2024 with a chief complaint of abdominal pain for past week.   Assessment:  Perforated appendicitis with abdominal and pelvic abscesses per CT scan.  S/p CT guided right transgluteal drain placement yesterday.   Blood cx (anaerobic bottle of 1 set only) + staph species per BCID.  1/15 Update: Blood cx now also + GNR; BCID + Bacteroides fragilis.  Cx from R pelvis abscess abscess also growing GNR.    Name of physician (or Provider) Contacted: DELENA Horns, NP  Current antibiotics: Zosyn   Changes to prescribed antibiotics recommended:  Patient on recommended treatment- Continue current antibiotics.  Results for orders placed or performed during the hospital encounter of 07/11/24  Blood Culture ID Panel (Reflexed) (Collected: 07/11/2024  6:40 PM)  Result Value Ref Range   Enterococcus faecalis NOT DETECTED NOT DETECTED   Enterococcus Faecium NOT DETECTED NOT DETECTED   Listeria monocytogenes NOT DETECTED NOT DETECTED   Staphylococcus species NOT DETECTED NOT DETECTED   Staphylococcus aureus (BCID) NOT DETECTED NOT DETECTED   Staphylococcus epidermidis NOT DETECTED NOT DETECTED   Staphylococcus lugdunensis NOT DETECTED NOT DETECTED   Streptococcus species NOT DETECTED NOT DETECTED   Streptococcus agalactiae NOT DETECTED NOT DETECTED   Streptococcus pneumoniae NOT DETECTED NOT DETECTED   Streptococcus pyogenes NOT DETECTED NOT DETECTED   A.calcoaceticus-baumannii NOT DETECTED NOT DETECTED   Bacteroides fragilis DETECTED (A) NOT DETECTED   Enterobacterales NOT DETECTED NOT DETECTED   Enterobacter cloacae complex NOT DETECTED NOT DETECTED   Escherichia coli NOT DETECTED NOT DETECTED   Klebsiella aerogenes NOT DETECTED NOT DETECTED   Klebsiella oxytoca NOT DETECTED NOT DETECTED   Klebsiella pneumoniae NOT DETECTED NOT DETECTED    Proteus species NOT DETECTED NOT DETECTED   Salmonella species NOT DETECTED NOT DETECTED   Serratia marcescens NOT DETECTED NOT DETECTED   Haemophilus influenzae NOT DETECTED NOT DETECTED   Neisseria meningitidis NOT DETECTED NOT DETECTED   Pseudomonas aeruginosa NOT DETECTED NOT DETECTED   Stenotrophomonas maltophilia NOT DETECTED NOT DETECTED   Candida albicans NOT DETECTED NOT DETECTED   Candida auris NOT DETECTED NOT DETECTED   Candida glabrata NOT DETECTED NOT DETECTED   Candida krusei NOT DETECTED NOT DETECTED   Candida parapsilosis NOT DETECTED NOT DETECTED   Candida tropicalis NOT DETECTED NOT DETECTED   Cryptococcus neoformans/gattii NOT DETECTED NOT DETECTED    Rosaline Millet PharmD 07/14/2024  2:34 AM

## 2024-07-14 NOTE — Progress Notes (Signed)
" °   07/14/24 1042  TOC Brief Assessment  Insurance and Status Reviewed  Patient has primary care physician Yes  Home environment has been reviewed home wiht spouse  Prior level of function: independent  Prior/Current Home Services No current home services  Social Drivers of Health Review SDOH reviewed no interventions necessary  Readmission risk has been reviewed Yes  Transition of care needs no transition of care needs at this time    "

## 2024-07-14 NOTE — Progress Notes (Signed)
 "      Subjective: Continues to feel much better.  Passing flatus and had a BM this morning.  NGT with minimal output today.  NGT has been clamped since 0800 this am and doing well with no issues.  hungry  Objective: Vital signs in last 24 hours: Temp:  [98 F (36.7 C)-98.5 F (36.9 C)] 98 F (36.7 C) (01/15 0540) Pulse Rate:  [67-80] 68 (01/15 0829) Resp:  [18-20] 18 (01/14 2152) BP: (123-149)/(38-65) 149/62 (01/15 0829) SpO2:  [97 %-100 %] 100 % (01/15 0540) Last BM Date : 07/14/24 (patient reported)  Intake/Output from previous day: 01/14 0701 - 01/15 0700 In: 1615.5 [I.V.:1417.2; IV Piggyback:198.3] Out: 2410 [Urine:1300; Emesis/NG output:1025; Drains:85] Intake/Output this shift: Total I/O In: 420.7 [I.V.:408.5; IV Piggyback:12.1] Out: 250 [Urine:250]  PE: Gen: NAD Abd: soft, NGT clamped, minimally tender, IR drain with slightly cloudy serous fluid, 85 cc yesterday  Lab Results:  Recent Labs    07/13/24 0029 07/14/24 0419  WBC 11.8* 11.1*  HGB 14.8 14.0  HCT 42.6 42.1  PLT 306 330   BMET Recent Labs    07/13/24 0029 07/14/24 0419  NA 138 142  K 3.3* 3.9  CL 102 107  CO2 23 22  GLUCOSE 100* 93  BUN 34* 27*  CREATININE 1.22 1.12  CALCIUM 9.2 9.1   PT/INR Recent Labs    07/12/24 1130  LABPROT 15.1  INR 1.1   CMP     Component Value Date/Time   NA 142 07/14/2024 0419   K 3.9 07/14/2024 0419   CL 107 07/14/2024 0419   CO2 22 07/14/2024 0419   GLUCOSE 93 07/14/2024 0419   BUN 27 (H) 07/14/2024 0419   CREATININE 1.12 07/14/2024 0419   CALCIUM 9.1 07/14/2024 0419   PROT 6.4 (L) 07/14/2024 0419   ALBUMIN 3.2 (L) 07/14/2024 0419   AST 31 07/14/2024 0419   ALT 60 (H) 07/14/2024 0419   ALKPHOS 74 07/14/2024 0419   BILITOT 1.1 07/14/2024 0419   GFRNONAA >60 07/14/2024 0419   Lipase     Component Value Date/Time   LIPASE 17 07/11/2024 1521       Studies/Results: DG Abd Portable 1V Result Date: 07/13/2024 CLINICAL DATA:  Small bowel  obstruction. EXAM: PORTABLE ABDOMEN - 1 VIEW COMPARISON:  Abdominal radiograph dated 07/12/2024. FINDINGS: Partially visualized enteric tube with tip in the proximal stomach and side-port in the region of the GE junction. Recommend further advancing by additional 5 cm. Diffusely dilated small bowel loops measure up to 5 cm in caliber. A pigtail catheter noted over the pelvis. Degenerative changes of the spine. No acute osseous pathology. IMPRESSION: 1. Persistent small bowel obstruction. 2. Recommend further advancing the enteric tube by additional 5 cm. Electronically Signed   By: Vanetta Chou M.D.   On: 07/13/2024 13:40   ECHOCARDIOGRAM COMPLETE Result Date: 07/13/2024    ECHOCARDIOGRAM REPORT   Patient Name:   Luke Dalton Date of Exam: 07/12/2024 Medical Rec #:  [id number]        Height:       70.0 in Accession #:    [id number]       Weight:       186.5 lb Date of Birth:  03-15-1951        BSA:          2.026 m Patient Age:    74 years         BP:  113/66 mmHg Patient Gender: M                HR:           95 bpm. Exam Location:  Inpatient Procedure: 2D Echo, Cardiac Doppler and Color Doppler (Both Spectral and Color            Flow Doppler were utilized during procedure). Indications:     Abnormal ECG 194.31  History:         Patient has no prior history of Echocardiogram examinations.                  Risk Factors:Hypertension.  Sonographer:     Merlynn Argyle Referring Phys:  [id number] SUMAYYA AMIN Diagnosing Phys: Darryle Decent MD  Sonographer Comments: Image acquisition challenging due to respiratory motion. IMPRESSIONS  1. Left ventricular ejection fraction, by estimation, is 60 to 65%. The left ventricle has normal function. The left ventricle has no regional wall motion abnormalities. Indeterminate diastolic filling due to E-A fusion.  2. Right ventricular systolic function is normal. The right ventricular size is normal. There is normal pulmonary artery systolic pressure. The estimated right  ventricular systolic pressure is 22.4 mmHg.  3. The mitral valve is grossly normal. Trivial mitral valve regurgitation. No evidence of mitral stenosis.  4. The aortic valve is tricuspid. Aortic valve regurgitation is mild. No aortic stenosis is present.  5. The inferior vena cava is normal in size with greater than 50% respiratory variability, suggesting right atrial pressure of 3 mmHg. FINDINGS  Left Ventricle: Left ventricular ejection fraction, by estimation, is 60 to 65%. The left ventricle has normal function. The left ventricle has no regional wall motion abnormalities. The left ventricular internal cavity size was normal in size. There is  no left ventricular hypertrophy. Indeterminate diastolic filling due to E-A fusion. Right Ventricle: The right ventricular size is normal. No increase in right ventricular wall thickness. Right ventricular systolic function is normal. There is normal pulmonary artery systolic pressure. The tricuspid regurgitant velocity is 2.20 m/s, and  with an assumed right atrial pressure of 3 mmHg, the estimated right ventricular systolic pressure is 22.4 mmHg. Left Atrium: Left atrial size was normal in size. Right Atrium: Right atrial size was normal in size. Pericardium: Trivial pericardial effusion is present. Mitral Valve: The mitral valve is grossly normal. Trivial mitral valve regurgitation. No evidence of mitral valve stenosis. Tricuspid Valve: The tricuspid valve is grossly normal. Tricuspid valve regurgitation is trivial. No evidence of tricuspid stenosis. Aortic Valve: The aortic valve is tricuspid. Aortic valve regurgitation is mild. No aortic stenosis is present. Pulmonic Valve: The pulmonic valve was grossly normal. Pulmonic valve regurgitation is trivial. No evidence of pulmonic stenosis. Aorta: The aortic root and ascending aorta are structurally normal, with no evidence of dilitation. Venous: The inferior vena cava is normal in size with greater than 50% respiratory  variability, suggesting right atrial pressure of 3 mmHg. IAS/Shunts: The atrial septum is grossly normal.  LEFT VENTRICLE PLAX 2D LVIDd:         4.00 cm LVIDs:         3.10 cm LV PW:         1.00 cm LV IVS:        1.10 cm  RIGHT VENTRICLE          IVC RV Basal diam:  3.60 cm  IVC diam: 1.00 cm TAPSE (M-mode): 2.6 cm LEFT ATRIUM  Index        RIGHT ATRIUM           Index LA diam:        4.30 cm 2.12 cm/m   RA Area:     14.80 cm LA Vol (A2C):   35.4 ml 17.47 ml/m  RA Volume:   29.80 ml  14.71 ml/m LA Vol (A4C):   33.2 ml 16.38 ml/m LA Biplane Vol: 35.0 ml 17.27 ml/m  AORTIC VALVE             PULMONIC VALVE LVOT Vmax:   64.00 cm/s  PR End Diast Vel: 6.15 msec LVOT Vmean:  46.200 cm/s LVOT VTI:    0.117 m  AORTA Ao Root diam: 3.50 cm Ao Asc diam:  3.80 cm TRICUSPID VALVE TR Peak grad:   19.4 mmHg TR Vmax:        220.00 cm/s  SHUNTS Systemic VTI: 0.12 m Darryle Decent MD Electronically signed by Darryle Decent MD Signature Date/Time: 07/12/2024/2:09:26 PM    Final (Updated)    CT GUIDED PERITONEAL/RETROPERITONEAL FLUID DRAIN BY PERC CATH Result Date: 07/12/2024 INDICATION: 74 year old male with history of perforated appendicitis complicated by pelvic abscess formation. EXAM: CT PERC DRAIN PERITONEAL ABCESS COMPARISON:  07/11/2024 MEDICATIONS: The patient is currently admitted to the hospital and receiving intravenous antibiotics. The antibiotics were administered within an appropriate time frame prior to the initiation of the procedure. ANESTHESIA/SEDATION: Moderate (conscious) sedation was employed during this procedure. A total of Versed  1 mg and Fentanyl  50 mcg was administered intravenously. Moderate Sedation Time: 13 minutes. The patient's level of consciousness and vital signs were monitored continuously by radiology nursing throughout the procedure under my direct supervision. CONTRAST:  None COMPLICATIONS: None immediate. PROCEDURE: RADIATION DOSE REDUCTION: This exam was performed according to  the departmental dose-optimization program which includes automated exposure control, adjustment of the mA and/or kV according to patient size and/or use of iterative reconstruction technique. Informed written consent was obtained from the patient after a discussion of the risks, benefits and alternatives to treatment. The patient was placed prone on the CT gantry and a pre procedural CT was performed re-demonstrating the known abscess/fluid collection within the pelvis. The procedure was planned. A timeout was performed prior to the initiation of the procedure. The right trans gluteal region was prepped and draped in the usual sterile fashion. The overlying soft tissues were anesthetized with 1% lidocaine  with epinephrine. Appropriate trajectory was planned with the use of a 22 gauge spinal needle. An 18 gauge trocar needle was advanced into the abscess/fluid collection and a short Amplatz super stiff wire was coiled within the collection. Appropriate positioning was confirmed with a limited CT scan. The tract was serially dilated allowing placement of a 10 French all-purpose drainage catheter. Appropriate positioning was confirmed with a limited postprocedural CT scan. Approximately 100 ml of purulent fluid was aspirated. The tube was connected to a bulb suction and sutured in place. A dressing was placed. The patient tolerated the procedure well without immediate post procedural complication. IMPRESSION: Successful CT guided placement of a right transgluteal 10 French all purpose drain catheter into the pelvic abscess with aspiration of 100 mL of purulent fluid. Samples were sent to the laboratory as requested by the ordering clinical team. Ester Sides, MD [facility] Electronically Signed   By: Ester Sides M.D.   On: 07/12/2024 15:23   DG Abd Portable 1V Result Date: 07/12/2024 CLINICAL DATA:  Nasogastric tube placement. EXAM:  PORTABLE ABDOMEN - 1  VIEW COMPARISON:  Yesterday FINDINGS: Nasogastric tube tip is seen in proximal stomach. Small bowel dilatation is noted concerning for distal small bowel obstruction. IMPRESSION: Nasogastric tube tip seen in proximal stomach. Small bowel dilatation is noted concerning for distal small bowel obstruction. Electronically Signed   By: Lynwood Landy Raddle M.D.   On: 07/12/2024 12:46    Anti-infectives: Anti-infectives (From admission, onward)    Start     Dose/Rate Route Frequency Ordered Stop   07/12/24 0300  piperacillin -tazobactam (ZOSYN ) IVPB 3.375 g  Status:  Discontinued        3.375 g 12.5 mL/hr over 240 Minutes Intravenous Every 8 hours 07/11/24 1842 07/11/24 1843   07/12/24 0100  piperacillin -tazobactam (ZOSYN ) IVPB 3.375 g        3.375 g 12.5 mL/hr over 240 Minutes Intravenous Every 8 hours 07/11/24 1843     07/11/24 1815  piperacillin -tazobactam (ZOSYN ) IVPB 3.375 g        3.375 g 100 mL/hr over 30 Minutes Intravenous  Once 07/11/24 1814 07/11/24 1946        Assessment/Plan Acute perforated appendicitis with multiple abscesses and reactive SBO -patient feels better today and WBC down to 11.1, AF -having bowel function and NGT already clamped for 3 hrs with no issues.  Cont clamping and allow CLD.  Likely DC NGT later today vs tomorrow -cont IV abx therapy -cont IR drain -attempt to get him better with no surgery this admission if he continues to improve with conservative management.  Would likely plan for interval appy in 6-8 weeks -cont IV heparin  for now until we are more sure he won't need surgery or further IR intervention   FEN - NPO/NGT clamped, CLD/IVFs VTE - heparin  gtt, held this am due to concern for blood in NGT.  Hgb stable, ok to resume gtt.  D/w primary and cards ID - zosyn   New onset a fib - now in NSR HTN  I reviewed Consultant cardiology notes, hospitalist notes, last 24 h vitals and pain scores, last 48 h intake and output, last 24 h labs and trends, and last  24 h imaging results.   LOS: 3 days    Burnard FORBES Banter , [facility] 07/14/2024, 11:04 AM Please see Amion for pager number during day hours 7:00am-4:30pm or 7:00am -11:30am on weekends  "

## 2024-07-15 DIAGNOSIS — I480 Paroxysmal atrial fibrillation: Secondary | ICD-10-CM | POA: Diagnosis not present

## 2024-07-15 DIAGNOSIS — K3532 Acute appendicitis with perforation and localized peritonitis, without abscess: Secondary | ICD-10-CM | POA: Diagnosis not present

## 2024-07-15 LAB — CBC
HCT: 36.2 % — ABNORMAL LOW (ref 39.0–52.0)
Hemoglobin: 11.8 g/dL — ABNORMAL LOW (ref 13.0–17.0)
MCH: 32.5 pg (ref 26.0–34.0)
MCHC: 32.6 g/dL (ref 30.0–36.0)
MCV: 99.7 fL (ref 80.0–100.0)
Platelets: 315 K/uL (ref 150–400)
RBC: 3.63 MIL/uL — ABNORMAL LOW (ref 4.22–5.81)
RDW: 13.2 % (ref 11.5–15.5)
WBC: 10.4 K/uL (ref 4.0–10.5)
nRBC: 0 % (ref 0.0–0.2)

## 2024-07-15 LAB — AEROBIC/ANAEROBIC CULTURE W GRAM STAIN (SURGICAL/DEEP WOUND)

## 2024-07-15 LAB — CULTURE, BLOOD (ROUTINE X 2): Culture  Setup Time: NO GROWTH

## 2024-07-15 MED ORDER — APIXABAN 5 MG PO TABS
5.0000 mg | ORAL_TABLET | Freq: Two times a day (BID) | ORAL | Status: DC
  Administered 2024-07-15 – 2024-07-16 (×3): 5 mg via ORAL
  Filled 2024-07-15 (×3): qty 1

## 2024-07-15 MED ORDER — METRONIDAZOLE 500 MG PO TABS
500.0000 mg | ORAL_TABLET | Freq: Three times a day (TID) | ORAL | Status: DC
  Administered 2024-07-15 – 2024-07-16 (×3): 500 mg via ORAL
  Filled 2024-07-15 (×3): qty 1

## 2024-07-15 MED ORDER — SODIUM CHLORIDE 0.9 % IV SOLN
2.0000 g | INTRAVENOUS | Status: DC
  Administered 2024-07-15 – 2024-07-19 (×4): 2 g via INTRAVENOUS
  Filled 2024-07-15 (×4): qty 20

## 2024-07-15 NOTE — Care Management Important Message (Signed)
 Important Message  Patient Details IM Letter given. Name: Luke Dalton MRN: [id number] Date of Birth: 1950/11/02   Important Message Given:  Yes - Medicare IM     [name] 07/15/2024, 3:42 PM

## 2024-07-15 NOTE — Progress Notes (Signed)
 "      Subjective: Feels great!  Walked 15 laps around the unit.  + flatus and BMs.  Doing well with NGT clamped.  Tolerating CLD  Objective: Vital signs in last 24 hours: Temp:  [97.6 F (36.4 C)-97.9 F (36.6 C)] 97.9 F (36.6 C) (01/16 0640) Pulse Rate:  [63-71] 67 (01/16 0640) Resp:  [18-19] 19 (01/16 0640) BP: (129-146)/(57-66) 144/66 (01/16 0640) SpO2:  [98 %-100 %] 98 % (01/16 0640) Last BM Date : 07/14/24 (patient reported)  Intake/Output from previous day: 01/15 0701 - 01/16 0700 In: 3692.3 [P.O.:1060; I.V.:2475.9; IV Piggyback:151.4] Out: 1335 [Urine:950; Emesis/NG output:350; Drains:35] Intake/Output this shift: No intake/output data recorded.  PE: Gen: NAD Abd: soft, NGT clamped, nontender, IR drain with serous fluid, 35 cc yesterday  Lab Results:  Recent Labs    07/14/24 0419 07/15/24 0404  WBC 11.1* 10.4  HGB 14.0 11.8*  HCT 42.1 36.2*  PLT 330 315   BMET Recent Labs    07/14/24 0419 07/14/24 1427  NA 142 143  K 3.9 3.8  CL 107 107  CO2 22 23  GLUCOSE 93 89  BUN 27* 24*  CREATININE 1.12 1.03  CALCIUM 9.1 8.8*   PT/INR Recent Labs    07/12/24 1130  LABPROT 15.1  INR 1.1   CMP     Component Value Date/Time   NA 143 07/14/2024 1427   K 3.8 07/14/2024 1427   CL 107 07/14/2024 1427   CO2 23 07/14/2024 1427   GLUCOSE 89 07/14/2024 1427   BUN 24 (H) 07/14/2024 1427   CREATININE 1.03 07/14/2024 1427   CALCIUM 8.8 (L) 07/14/2024 1427   PROT 6.1 (L) 07/14/2024 1427   ALBUMIN 3.1 (L) 07/14/2024 1427   AST 31 07/14/2024 1427   ALT 55 (H) 07/14/2024 1427   ALKPHOS 79 07/14/2024 1427   BILITOT 1.1 07/14/2024 1427   GFRNONAA >60 07/14/2024 1427   Lipase     Component Value Date/Time   LIPASE 17 07/11/2024 1521       Studies/Results: No results found.   Anti-infectives: Anti-infectives (From admission, onward)    Start     Dose/Rate Route Frequency Ordered Stop   07/12/24 0300  piperacillin -tazobactam (ZOSYN ) IVPB 3.375 g   Status:  Discontinued        3.375 g 12.5 mL/hr over 240 Minutes Intravenous Every 8 hours 07/11/24 1842 07/11/24 1843   07/12/24 0100  piperacillin -tazobactam (ZOSYN ) IVPB 3.375 g        3.375 g 12.5 mL/hr over 240 Minutes Intravenous Every 8 hours 07/11/24 1843     07/11/24 1815  piperacillin -tazobactam (ZOSYN ) IVPB 3.375 g        3.375 g 100 mL/hr over 30 Minutes Intravenous  Once 07/11/24 1814 07/11/24 1946        Assessment/Plan Acute perforated appendicitis with multiple abscesses and reactive SBO -patient feels better today and WBC is normal, AF -having bowel function  -tolerating CLD and NGT clamping.  DC and start FLD, likely soft in am -cont IV abx therapy -cont IR drain -improving with no surgery this admission. if he continues to improve with conservative management, would likely plan for interval appy in 6-8 weeks -cont IV heparin  for now until we are more sure he won't need surgery or further IR intervention.  ? Need for repeat CT over the weekend to eval current drain, but also to eval other fluid collections that were noted on first scan.  Will d/w MD -hopefully can start transitioning towards  DC this weekend  FEN - FLD VTE - heparin  gtt ID - [email or web address]   New onset a fib - now in NSR HTN  I reviewed Consultant cardiology notes, hospitalist notes, last 24 h vitals and pain scores, last 48 h intake and output, last 24 h labs and trends, and last 24 h imaging results.   LOS: 4 days    Burnard FORBES Banter , [facility] 07/15/2024, 10:56 AM Please see Amion for pager number during day hours 7:00am-4:30pm or 7:00am -11:30am on weekends  "

## 2024-07-15 NOTE — Progress Notes (Signed)
" ° ° °  Subjective:  Seems in better spirits NG tube still in   Objective:  Vitals:   07/14/24 0829 07/14/24 1323 07/14/24 2206 07/15/24 0640  BP: (!) 149/62 (!) 129/57 (!) 146/58 (!) 144/66  Pulse: 68 63 71 67  Resp:  18 19 19   Temp:  97.6 F (36.4 C) 97.9 F (36.6 C) 97.9 F (36.6 C)  TempSrc:  Oral Oral Oral  SpO2:  100% 98% 98%  Weight:      Height:        Intake/Output from previous day:  Intake/Output Summary (Last 24 hours) at 07/15/2024 1016 Last data filed at 07/15/2024 0530 Gross per 24 hour  Intake 3271.69 ml  Output 1085 ml  Net 2186.69 ml    Physical Exam: Feels miserable NG tube with hematochezia  Lungs clear No murmur Abdomen with right posterior appy drain in No edema  Lab Results: Basic Metabolic Panel: Recent Labs    07/14/24 0419 07/14/24 1427  NA 142 143  K 3.9 3.8  CL 107 107  CO2 22 23  GLUCOSE 93 89  BUN 27* 24*  CREATININE 1.12 1.03  CALCIUM 9.1 8.8*   Liver Function Tests: Recent Labs    07/14/24 0419 07/14/24 1427  AST 31 31  ALT 60* 55*  ALKPHOS 74 79  BILITOT 1.1 1.1  PROT 6.4* 6.1*  ALBUMIN 3.2* 3.1*   No results for input(s): LIPASE, AMYLASE in the last 72 hours.  CBC: Recent Labs    07/14/24 0419 07/15/24 0404  WBC 11.1* 10.4  HGB 14.0 11.8*  HCT 42.1 36.2*  MCV 99.3 99.7  PLT 330 315     Imaging: No results found.   Cardiac Studies:  ECG: afib rate 104 nonspecific ST changes   Telemetry:  Some fib/flutter, some NSR and 4 beats   Echo: EF 60-65% normal RV mild AR  Medications:    DULoxetine   30 mg Oral BID   metoprolol  tartrate  50 mg Oral BID   montelukast   10 mg Oral BH-q7a   pantoprazole  (PROTONIX ) IV  40 mg Intravenous Q24H   sodium chloride  flush  3 mL Intravenous Q12H   sodium chloride  flush  5 mL Intracatheter Q8H   tamsulosin   0.4 mg Oral Daily      lactated ringers  100 mL/hr at 07/15/24 0207   piperacillin -tazobactam (ZOSYN )  IV 3.375 g (07/15/24 0203)    Assessment/Plan:     Preoperative:  Echo has normal EF no clinical CAD Prior to admission functional capacity good. PAF  Continue beta blocker On Zosyn  ?  Plan seems to be to advance diet and take NG tube out. Delay surgery for ? Weeks with prolonged antibiotics. Drain I clearing up. Will start oral eliquis  today Will need 14 day monitor on d/c and f/u with me or afib clinic   Luke Dalton 07/15/2024, 10:16 AM    "

## 2024-07-15 NOTE — Progress Notes (Addendum)
 " [facility] Progress Note  Patient: Luke Dalton     FMW:[id number]  DOA: 07/11/2024   PCP: Chrystal Lamarr RAMAN, MD       Brief hospital course: 74 y.o. male with PMH of f hypertension, dyslipidemia, BPH and spinal stenosis s/p of steroid injection a week ago came to ED abdominal pain RLQ sicne Wednesday 07/06/24 with associated nausea, some diarrhea.  The patient states that the pain was on and off on Wednesday and seem to improve with heat and ice.  However, on the following day the pain  became severe and constant.  He was following up with his neurosurgeon on Monday who evaluated him and recommended that he go to the emergency department to have his appendix assessed.  In ED A-fib/flutter (new) WBC 20.2 Cr 1.41 Bicarb 21 with AG of 18 Mildly elevated LFTs U sp gravity> 1.046  CT a/p Perforated appendix with abd/ pelvic abscesses, some SBO 1/13- transgluteal drain by IR- 150 cc of purulent fluid  Subjective:  No complaints.  He did 12 laps in the hall and is tolerating clear liquids.  Assessment and Plan: Principal Problem:   Perforated appendix resulting in multiple intra-abdominal abscesses, reactive SBO - general surgery managing -has NG tube which is draining dark brown-colored fluid on my eval - appendectomy planned for a later date - colonoscopy in 6-8 wks to r/o malignancy - WBC 20.2> 11.8 - Culture reveals abundant E. coli, abundant Bacteroides fragilis, beta-lactamase positive - Continue Zosyn  - Being advanced to full liquids today-DC LR -Drain output 10 cc - CT abdomen pelvis ordered for tomorrow by general surgery   Active Problems:  Bacteremia with bacteroid fragilis - present in both sets of blood cultures - cont Zosyn   1/4 bottles growing  staph capitis -likely contaminant     New onset atrial flutter - heparin  infusion held on 1/15   - Metoprolol  50 mg BID - 2D echo reveals an EF of 60 to 65%, indeterminant diastolic filling pressure  due to E-A fusion - Patient was asymptomatic - Appreciate cardiology eval - Converted back to sinus rhythm - Eliquis  started today  Hypokalemia - Replaced  Elevated LFTs - Possibly secondary to perforated appendix - are improving - No old labs to compare with.    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  OA b/l knees - Cymbalta  and PT - follows with neurology and ortho    Code Status: Full Code Total time on patient care: 30 minutes DVT prophylaxis:  Heparin  infusion apixaban  (ELIQUIS ) tablet 5 mg     Objective:   Vitals:   07/14/24 0829 07/14/24 1323 07/14/24 2206 07/15/24 0640  BP: (!) 149/62 (!) 129/57 (!) 146/58 (!) 144/66  Pulse: 68 63 71 67  Resp:  18 19 19   Temp:  97.6 F (36.4 C) 97.9 F (36.6 C) 97.9 F (36.6 C)  TempSrc:  Oral Oral Oral  SpO2:  100% 98% 98%  Weight:      Height:       Filed Weights   07/11/24 1500 07/11/24 1651 07/12/24 0721  Weight: 79.8 kg 84.6 kg 84.6 kg   Exam: General exam: Appears comfortable  HEENT: oral mucosa moist-NG tube  present Respiratory system: Clear to auscultation.  Cardiovascular system: S1 & S2 heard-irregularly irregular rate and rhythm Gastrointestinal system: Abdomen soft, non-tender, nondistended. Normal bowel sounds-drain noted with milky fluid Extremities: No cyanosis, clubbing or edema Psychiatry:  Mood & affect appropriate.      CBC: Recent Labs  Lab 07/11/24 1521 07/12/24 0642 07/13/24 0029 07/14/24 0419 07/15/24 0404  WBC 20.2* 14.1* 11.8* 11.1* 10.4  NEUTROABS 17.7*  --   --   --   --   HGB 17.0 15.4 14.8 14.0 11.8*  HCT 49.2 46.0 42.6 42.1 36.2*  MCV 94.4 97.7  95.5 99.3 99.7  PLT 344 269 306 330 315   Basic Metabolic Panel: Recent Labs  Lab 07/11/24 1521 07/12/24 0642 07/13/24 0029 07/14/24 0419 07/14/24 1427  NA 137 136 138 142 143  K 3.5 3.9 3.3* 3.9 3.8  CL 98 98 102 107 107  CO2 21* 24 23 22 23   GLUCOSE 152* 120* 100* 93 89  BUN 38* 34* 34* 27* 24*  CREATININE 1.41* 1.29* 1.22 1.12 1.03  CALCIUM 10.4* 9.3 9.2 9.1 8.8*     Scheduled Meds:  apixaban   5 mg Oral BID   DULoxetine   30 mg Oral BID   metoprolol  tartrate  50 mg Oral BID   montelukast   10 mg Oral BH-q7a   pantoprazole  (PROTONIX ) IV  40 mg Intravenous Q24H   sodium chloride  flush  3 mL Intravenous Q12H   sodium chloride  flush  5 mL Intracatheter Q8H   tamsulosin   0.4 mg Oral Daily    Imaging and lab data personally reviewed   Author: Paradise Vensel  07/15/2024 1:16 PM  To contact [facility]>   Check the care team in [facility] and look for the attending/consulting [facility] provider listed  Log into [email or web address] and use [place]'s universal password   Go to> [facility]  and find provider  If you still have difficulty reaching the provider, please page the [facility] (Director on Call) for the Hospitalists listed on amion     "

## 2024-07-15 NOTE — Progress Notes (Addendum)
 "   Referring Physician(s): Maczis,M,PA-C   Supervising Physician: Luverne Aran  Patient Status:  [facility] - In-pt  Chief Complaint:  S/p 1/13 R TG drain placement by Dr. Jennefer  Brief Hx: Patient who presented to ED on 07/11/24 for RLQ abd pain, fever, nausea with work up significant for new onset afib, perforated appendicitis with abdominal pelvic abscesses, associated small bowel obstruction, sigmoid colon diverticulosis. Patient was admitted, started on abx, IR consulted for drain placement. R TG drain placed 07/12/24 (10Fr) by Dr. Jennefer with 150 mL aspirated at that time.   Subjective:  NG tube removed today which patient is pleased about. Had just eaten clear liquid diet lunch when I arrived. Reports that abd pain has improved from that which brought him to the ED originally. Denies fever, chills. Alert, pleasant, interested in drain care.   Allergies: Losartan  Medications: Prior to Admission medications  Medication Sig Start Date End Date Taking? Authorizing Provider  Ascorbic Acid (VITAMIN C) 1000 MG tablet Take 1,000 mg by mouth daily.   Yes [provider]  Cholecalciferol (VITAMIN D3) 50 MCG (2000 UT) TABS Take 1 tablet by mouth daily.   Yes [provider]  cyanocobalamin  (VITAMIN B12) 500 MCG tablet Take 500 mcg by mouth daily.   Yes [provider]  DULoxetine  (CYMBALTA ) 30 MG capsule Take 1 capsule (30 mg total) by mouth 2 (two) times daily. 07/06/24  Yes Patel, Donika K, DO  famotidine (PEPCID) 40 MG tablet Take 40 mg by mouth as needed for heartburn.   Yes [provider]  losartan (COZAAR) 25 MG tablet Take 25 mg by mouth daily.   Yes [provider]  montelukast  (SINGULAIR ) 10 MG tablet Take 10 mg by mouth every morning.   Yes [provider]  Omega-3 Fatty Acids (FISH OIL PO) Take 1 capsule by mouth daily.   Yes [provider]  simvastatin  (ZOCOR ) 40 MG tablet Take 40 mg by mouth every other  day. Patient taking differently: Take 20 mg by mouth daily.   Yes [provider]  tamsulosin  (FLOMAX ) 0.4 MG CAPS capsule Take 0.4 mg by mouth every other day.   Yes [provider]     Vital Signs: BP (!) 144/66 (BP Location: Right Arm)   Pulse 67   Temp 97.9 F (36.6 C) (Oral)   Resp 19   Ht 5' 10 (1.778 m)   Wt 186 lb 8.2 oz (84.6 kg)   SpO2 98%   BMI 26.76 kg/m   Physical Exam Pulmonary:     Effort: Pulmonary effort is normal.  Abdominal:     General: There is no distension.     Palpations: Abdomen is soft.     Tenderness: There is no abdominal tenderness.     Comments: R TG drain dressing C/D/I, serous fluid present in appropriately charged bulb. F/A easily.   Skin:    General: Skin is warm and dry.  Neurological:     Mental Status: He is alert and oriented to person, place, and time.  Psychiatric:        Mood and Affect: Mood normal.        Behavior: Behavior normal.     Imaging: DG Abd Portable 1V Result Date: 07/13/2024 CLINICAL DATA:  Small bowel obstruction. EXAM: PORTABLE ABDOMEN - 1 VIEW COMPARISON:  Abdominal radiograph dated 07/12/2024. FINDINGS: Partially visualized enteric tube with tip in the proximal stomach and side-port in the region of the GE junction. Recommend further advancing by  additional 5 cm. Diffusely dilated small bowel loops measure up to 5 cm in caliber. A pigtail catheter noted over the pelvis. Degenerative changes of the spine. No acute osseous pathology. IMPRESSION: 1. Persistent small bowel obstruction. 2. Recommend further advancing the enteric tube by additional 5 cm. Electronically Signed   By: Vanetta Chou M.D.   On: 07/13/2024 13:40   ECHOCARDIOGRAM COMPLETE Result Date: 07/13/2024    ECHOCARDIOGRAM REPORT   Patient Name:   Luke Dalton Date of Exam: 07/12/2024 Medical Rec #:  [id number]        Height:       70.0 in Accession #:    [id number]       Weight:       186.5 lb Date of Birth:  [birth year]        BSA:           2.026 m Patient Age:    74 years         BP:           113/66 mmHg Patient Gender: M                HR:           95 bpm. Exam Location:  Inpatient Procedure: 2D Echo, Cardiac Doppler and Color Doppler (Both Spectral and Color            Flow Doppler were utilized during procedure). Indications:     Abnormal ECG 194.31  History:         Patient has no prior history of Echocardiogram examinations.                  Risk Factors:Hypertension.  Sonographer:     Merlynn Argyle Referring Phys:  [id number] SUMAYYA AMIN Diagnosing Phys: Darryle Decent MD  Sonographer Comments: Image acquisition challenging due to respiratory motion. IMPRESSIONS  1. Left ventricular ejection fraction, by estimation, is 60 to 65%. The left ventricle has normal function. The left ventricle has no regional wall motion abnormalities. Indeterminate diastolic filling due to E-A fusion.  2. Right ventricular systolic function is normal. The right ventricular size is normal. There is normal pulmonary artery systolic pressure. The estimated right ventricular systolic pressure is 22.4 mmHg.  3. The mitral valve is grossly normal. Trivial mitral valve regurgitation. No evidence of mitral stenosis.  4. The aortic valve is tricuspid. Aortic valve regurgitation is mild. No aortic stenosis is present.  5. The inferior vena cava is normal in size with greater than 50% respiratory variability, suggesting right atrial pressure of 3 mmHg. FINDINGS  Left Ventricle: Left ventricular ejection fraction, by estimation, is 60 to 65%. The left ventricle has normal function. The left ventricle has no regional wall motion abnormalities. The left ventricular internal cavity size was normal in size. There is  no left ventricular hypertrophy. Indeterminate diastolic filling due to E-A fusion. Right Ventricle: The right ventricular size is normal. No increase in right ventricular wall thickness. Right ventricular systolic function is normal. There is normal pulmonary  artery systolic pressure. The tricuspid regurgitant velocity is 2.20 m/s, and  with an assumed right atrial pressure of 3 mmHg, the estimated right ventricular systolic pressure is 22.4 mmHg. Left Atrium: Left atrial size was normal in size. Right Atrium: Right atrial size was normal in size. Pericardium: Trivial pericardial effusion is present. Mitral Valve: The mitral valve is grossly normal. Trivial mitral valve regurgitation. No evidence of mitral valve stenosis. Tricuspid Valve: The tricuspid valve is grossly  normal. Tricuspid valve regurgitation is trivial. No evidence of tricuspid stenosis. Aortic Valve: The aortic valve is tricuspid. Aortic valve regurgitation is mild. No aortic stenosis is present. Pulmonic Valve: The pulmonic valve was grossly normal. Pulmonic valve regurgitation is trivial. No evidence of pulmonic stenosis. Aorta: The aortic root and ascending aorta are structurally normal, with no evidence of dilitation. Venous: The inferior vena cava is normal in size with greater than 50% respiratory variability, suggesting right atrial pressure of 3 mmHg. IAS/Shunts: The atrial septum is grossly normal.  LEFT VENTRICLE PLAX 2D LVIDd:         4.00 cm LVIDs:         3.10 cm LV PW:         1.00 cm LV IVS:        1.10 cm  RIGHT VENTRICLE          IVC RV Basal diam:  3.60 cm  IVC diam: 1.00 cm TAPSE (M-mode): 2.6 cm LEFT ATRIUM             Index        RIGHT ATRIUM           Index LA diam:        4.30 cm 2.12 cm/m   RA Area:     14.80 cm LA Vol (A2C):   35.4 ml 17.47 ml/m  RA Volume:   29.80 ml  14.71 ml/m LA Vol (A4C):   33.2 ml 16.38 ml/m LA Biplane Vol: 35.0 ml 17.27 ml/m  AORTIC VALVE             PULMONIC VALVE LVOT Vmax:   64.00 cm/s  PR End Diast Vel: 6.15 msec LVOT Vmean:  46.200 cm/s LVOT VTI:    0.117 m  AORTA Ao Root diam: 3.50 cm Ao Asc diam:  3.80 cm TRICUSPID VALVE TR Peak grad:   19.4 mmHg TR Vmax:        220.00 cm/s  SHUNTS Systemic VTI: 0.12 m Darryle Decent MD Electronically signed by  Darryle Decent MD Signature Date/Time: 07/12/2024/2:09:26 PM    Final (Updated)    CT GUIDED PERITONEAL/RETROPERITONEAL FLUID DRAIN BY PERC CATH Result Date: 07/12/2024 INDICATION: 74 year old male with history of perforated appendicitis complicated by pelvic abscess formation. EXAM: CT PERC DRAIN PERITONEAL ABCESS COMPARISON:  07/11/2024 MEDICATIONS: The patient is currently admitted to the hospital and receiving intravenous antibiotics. The antibiotics were administered within an appropriate time frame prior to the initiation of the procedure. ANESTHESIA/SEDATION: Moderate (conscious) sedation was employed during this procedure. A total of Versed  1 mg and Fentanyl  50 mcg was administered intravenously. Moderate Sedation Time: 13 minutes. The patient's level of consciousness and vital signs were monitored continuously by radiology nursing throughout the procedure under my direct supervision. CONTRAST:  None COMPLICATIONS: None immediate. PROCEDURE: RADIATION DOSE REDUCTION: This exam was performed according to the departmental dose-optimization program which includes automated exposure control, adjustment of the mA and/or kV according to patient size and/or use of iterative reconstruction technique. Informed written consent was obtained from the patient after a discussion of the risks, benefits and alternatives to treatment. The patient was placed prone on the CT gantry and a pre procedural CT was performed re-demonstrating the known abscess/fluid collection within the pelvis. The procedure was planned. A timeout was performed prior to the initiation of the procedure. The right trans gluteal region was prepped and draped in the usual sterile fashion. The overlying soft tissues were anesthetized with 1% lidocaine  with epinephrine. Appropriate trajectory  was planned with the use of a 22 gauge spinal needle. An 18 gauge trocar needle was advanced into the abscess/fluid collection and a short Amplatz super stiff wire  was coiled within the collection. Appropriate positioning was confirmed with a limited CT scan. The tract was serially dilated allowing placement of a 10 French all-purpose drainage catheter. Appropriate positioning was confirmed with a limited postprocedural CT scan. Approximately 100 ml of purulent fluid was aspirated. The tube was connected to a bulb suction and sutured in place. A dressing was placed. The patient tolerated the procedure well without immediate post procedural complication. IMPRESSION: Successful CT guided placement of a right transgluteal 10 French all purpose drain catheter into the pelvic abscess with aspiration of 100 mL of purulent fluid. Samples were sent to the laboratory as requested by the ordering clinical team. Ester Sides, MD [facility] Electronically Signed   By: Ester Sides M.D.   On: 07/12/2024 15:23   DG Abd Portable 1V Result Date: 07/12/2024 CLINICAL DATA:  Nasogastric tube placement. EXAM: PORTABLE ABDOMEN - 1 VIEW COMPARISON:  Yesterday FINDINGS: Nasogastric tube tip is seen in proximal stomach. Small bowel dilatation is noted concerning for distal small bowel obstruction. IMPRESSION: Nasogastric tube tip seen in proximal stomach. Small bowel dilatation is noted concerning for distal small bowel obstruction. Electronically Signed   By: Lynwood Landy Raddle M.D.   On: 07/12/2024 12:46   CT ABDOMEN PELVIS W CONTRAST Result Date: 07/11/2024 CLINICAL DATA:  Right lower quadrant abdominal pain. EXAM: CT ABDOMEN AND PELVIS WITH CONTRAST TECHNIQUE: Multidetector CT imaging of the abdomen and pelvis was performed using the standard protocol following bolus administration of intravenous contrast. RADIATION DOSE REDUCTION: This exam was performed according to the departmental dose-optimization program which includes automated exposure control, adjustment of the mA and/or kV according to patient size and/or use of iterative  reconstruction technique. CONTRAST:  OMNIPAQUE  IOHEXOL  300 MG/ML  SOLN COMPARISON:  None Available. FINDINGS: Lower chest: The lung bases demonstrate streaky bibasilar atelectasis or scarring changes. No infiltrates or effusions. No pericardial effusion. Hepatobiliary: No focal liver abnormality is seen. No gallstones, gallbladder wall thickening, or biliary dilatation. Pancreas: Unremarkable. No pancreatic ductal dilatation or surrounding inflammatory changes. Spleen: Normal in size without focal abnormality. Adrenals/Urinary Tract: The adrenal glands and kidneys are normal. Benign bilateral parapelvic renal cysts not requiring any further imaging evaluation or follow-up. The bladder is unremarkable. Stomach/Bowel: The stomach is unremarkable. The duodenum is normal. Dilated proximal and mid small bowel loops with air-fluid levels. Transition to decompressed and inflamed appearing mid distal ileal loops of small bowel. The colon is unremarkable. No colonic lesions. Advanced sigmoid colon diverticulosis. Extensive inflammatory process in the right lower quadrant and upper right pelvis. CT findings consistent with perforated appendicitis with abdominal and pelvic abscesses. The largest right lower quadrant abscess measures 6.4 x 3.4 cm and contains a small amount non dependent gas. The pelvic abscess is between the bladder and the rectum and measures approximately 7.3 x 5.0 cm. Multiple other smaller likely communicating abscesses in the right lower quadrant and pelvis. Vascular/Lymphatic: Atherosclerotic calcifications involving the aorta and iliac arteries but no aneurysm. The branch vessels are patent. The major venous structures are patent. Small mesenteric and retroperitoneal lymph nodes, likely reactive. No pelvic adenopathy. Reproductive: The prostate gland and seminal vesicles are unremarkable. Bilateral hydroceles are noted. Other: No free air is identified but small locules gas are noted in the  abscesses. Musculoskeletal: No significant bony findings.  Moderate degenerative changes involving the spine. IMPRESSION: 1. CT findings consistent with perforated appendicitis with abdominal and pelvic abscesses. Recommend surgery consultation. 2. Associated small bowel obstruction (likely functional as opposed to mechanical). Inflamed enhancing right lower quadrant 3. Advanced sigmoid colon diverticulosis. 4. Aortic atherosclerosis. Aortic Atherosclerosis (ICD10-I70.0). Electronically Signed   By: MYRTIS Stammer M.D.   On: 07/11/2024 17:56    Labs:  CBC: Recent Labs    07/12/24 0642 07/13/24 0029 07/14/24 0419 07/15/24 0404  WBC 14.1* 11.8* 11.1* 10.4  HGB 15.4 14.8 14.0 11.8*  HCT 46.0 42.6 42.1 36.2*  PLT 269 306 330 315    COAGS: Recent Labs    07/12/24 1130  INR 1.1    BMP: Recent Labs    07/12/24 0642 07/13/24 0029 07/14/24 0419 07/14/24 1427  NA 136 138 142 143  K 3.9 3.3* 3.9 3.8  CL 98 102 107 107  CO2 24 23 22 23   GLUCOSE 120* 100* 93 89  BUN 34* 34* 27* 24*  CALCIUM 9.3 9.2 9.1 8.8*  CREATININE 1.29* 1.22 1.12 1.03  GFRNONAA 58* >60 >60 >60    LIVER FUNCTION TESTS: Recent Labs    07/11/24 1521 07/13/24 0029 07/14/24 0419 07/14/24 1427  BILITOT 1.3* 1.4* 1.1 1.1  AST 102* 49* 31 31  ALT 128* 92* 60* 55*  ALKPHOS 102 85 74 79  PROT 7.8 6.6 6.4* 6.1*  ALBUMIN 3.8 3.2* 3.2* 3.1*    Assessment and Plan:  Drain Location: R TG Size: Fr size: 10 Fr Date of placement: 07/12/24  Currently to: Drain collection device: suction bulb 24 hour output:  Output by Drain (mL) 07/13/24 0701 - 07/13/24 1900 07/13/24 1901 - 07/14/24 0700 07/14/24 0701 - 07/14/24 1900 07/14/24 1901 - 07/15/24 0700 07/15/24 0701 - 07/15/24 0854  Closed System Drain Inferior;Right Back Bulb (JP) 10 Fr. 50 35 15 20     Interval imaging/drain manipulation:  CT ordered by [facility] planned for today.  Current examination: Flushes/aspirates easily.  Insertion site unremarkable. Suture  and stat lock in place. Dressed appropriately.  Output is 20 cc so far today, 30 yesterday. Excluding flush material, close to <10cc/day. Also encouraging is now serous appearance. Abd pain improved. WBC has improved to WNL. Culture + ABUNDANT E  COLI  VSS Remains on zosyn  Per surgery note - continue nonoperative management now with drainage and antibiotics in the hopes that he can have colonoscopy and interval appendectomy versus ileocecectomy in 6-8 weeks when it is safer.    Plan: Continue TID flushes with 5 cc NS. Record output Q shift. Dressing changes QD or PRN if soiled.  Call IR APP or on call IR MD if difficulty flushing or sudden change in drain output.  Repeat imaging/possible drain injection once output < 10 mL/QD (excluding flush material). Consideration for drain removal if output is < 10 mL/QD (excluding flush material), pending discussion with the providing surgical service.  Discharge planning: Please contact IR APP or on call IR MD prior to patient d/c to ensure appropriate follow up plans are in place. Typically patient will follow up with IR clinic 10-14 days post d/c for repeat imaging/possible drain injection. IR scheduler will contact patient with date/time of appointment. Patient will need to flush drain QD with 5 cc NS, record output QD, dressing changes every 2-3 days or earlier if soiled.   IR will continue to follow - please call with questions or concerns.     Electronically Signed: Laymon Coast, NP 07/15/2024, 8:49 AM  I spent a total of 15 Minutes at the the patient's bedside AND on the patient's hospital floor or unit, greater than 50% of which was counseling/coordinating care for s/p R TG drain placement for pelvic abscess.      "

## 2024-07-15 NOTE — Discharge Instructions (Signed)

## 2024-07-16 ENCOUNTER — Inpatient Hospital Stay (HOSPITAL_COMMUNITY)

## 2024-07-16 DIAGNOSIS — K3532 Acute appendicitis with perforation and localized peritonitis, without abscess: Secondary | ICD-10-CM | POA: Diagnosis not present

## 2024-07-16 LAB — CULTURE, BLOOD (ROUTINE X 2)

## 2024-07-16 LAB — CBC
HCT: 38.7 % — ABNORMAL LOW (ref 39.0–52.0)
Hemoglobin: 12.8 g/dL — ABNORMAL LOW (ref 13.0–17.0)
MCH: 32.7 pg (ref 26.0–34.0)
MCHC: 33.1 g/dL (ref 30.0–36.0)
MCV: 98.7 fL (ref 80.0–100.0)
Platelets: 295 K/uL (ref 150–400)
RBC: 3.92 MIL/uL — ABNORMAL LOW (ref 4.22–5.81)
RDW: 12.7 % (ref 11.5–15.5)
WBC: 11 K/uL — ABNORMAL HIGH (ref 4.0–10.5)
nRBC: 0 % (ref 0.0–0.2)

## 2024-07-16 MED ORDER — IOHEXOL 300 MG/ML  SOLN
100.0000 mL | Freq: Once | INTRAMUSCULAR | Status: AC | PRN
  Administered 2024-07-16: 100 mL via INTRAVENOUS

## 2024-07-16 MED ORDER — LACTATED RINGERS IV BOLUS: 1000.0000 mL | Freq: Three times a day (TID) | INTRAVENOUS | Status: DC | PRN

## 2024-07-16 MED ORDER — IOHEXOL 9 MG/ML PO SOLN: 500.0000 mL | ORAL | Status: AC

## 2024-07-16 NOTE — Progress Notes (Signed)
 " [facility] Progress Note  Patient: Luke Dalton     FMW:[id number]  DOA: 07/11/2024   PCP: Chrystal Lamarr RAMAN, MD       Brief hospital course: 74 y.o. male with PMH of f hypertension, dyslipidemia, BPH and spinal stenosis s/p of steroid injection a week ago came to ED abdominal pain RLQ sicne Wednesday 07/06/24 with associated nausea, some diarrhea.  The patient states that the pain was on and off on Wednesday and seem to improve with heat and ice.  However, on the following day the pain  became severe and constant.  He was following up with his neurosurgeon on Monday who evaluated him and recommended that he go to the emergency department to have his appendix assessed.  In ED A-fib/flutter (new) WBC 20.2 Cr 1.41 Bicarb 21 with AG of 18 Mildly elevated LFTs U sp gravity> 1.046  CT a/p Perforated appendix with abd/ pelvic abscesses, some SBO 1/13- transgluteal drain by IR- 150 cc of purulent fluid  Subjective:  He began to notice pain in his appendix again yesterday that started quite suddenly.   Assessment and Plan: Principal Problem:   Perforated appendix resulting in multiple intra-abdominal abscesses, reactive SBO - general surgery managing - R gluteal drain present and IR following - appendectomy planned for a later date - colonoscopy in 6-8 wks to r/o malignancy - WBC 20.2> 11.8 - Culture reveals abundant E. coli, abundant Bacteroides fragilis, beta-lactamase positive -  Zosyn  changed to Rocephin  yesterday by pharmacy - on solid diet now  Active Problems:  Bacteremia with bacteroid fragilis - present in both sets of blood cultures - see above   1/4 bottles growing  staph capitis -likely contaminant     New onset atrial flutter - heparin  infusion held on 1/15   - Metoprolol  50 mg BID - 2D echo reveals an EF of 60 to 65%, indeterminant diastolic filling pressure due to E-A fusion - Patient was asymptomatic - Appreciate cardiology eval - Converted  back to sinus rhythm - Eliquis  started 1/16   Hypokalemia - Replaced  Elevated LFTs - Possibly secondary to perforated appendix - are improving - No old labs to compare with.    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  OA b/l knees - Cymbalta  and PT - follows with neurology and ortho    Code Status: Full Code Total time on patient care: 30 minutes DVT prophylaxis:  Heparin  infusion apixaban  (ELIQUIS ) tablet 5 mg     Objective:   Vitals:   07/15/24 1445 07/15/24 2025 07/16/24 0511 07/16/24 1337  BP: (!) 123/52 (!) 118/59 117/73 (!) 110/53  Pulse: (!) 59 76 89 65  Resp: 18 18 18 12   Temp: 98.6 F (37 C) 98.7 F (37.1 C) 98.5 F (36.9 C) 98.3 F (36.8 C)  TempSrc: Oral Oral Oral   SpO2: 100% 100% 98% 100%  Weight:      Height:       Filed Weights   07/11/24 1500 07/11/24 1651 07/12/24 0721  Weight: 79.8 kg 84.6 kg 84.6 kg   Exam: General exam: Appears comfortable  HEENT: oral mucosa moist-NG tube present Respiratory system: Clear to auscultation.  Cardiovascular system: S1 &  S2 heard-irregularly irregular rate and rhythm Gastrointestinal system: Abdomen soft, non-tender, nondistended. Normal bowel sounds-drain noted with milky fluid Extremities: No cyanosis, clubbing or edema Psychiatry:  Mood & affect appropriate.      CBC: Recent Labs  Lab 07/11/24 1521 07/12/24 9357 07/13/24 0029 07/14/24 0419 07/15/24 0404 07/16/24 0356  WBC 20.2* 14.1* 11.8* 11.1* 10.4 11.0*  NEUTROABS 17.7*  --   --   --   --   --   HGB 17.0 15.4 14.8 14.0 11.8* 12.8*  HCT 49.2 46.0 42.6 42.1 36.2* 38.7*  MCV 94.4 97.7 95.5 99.3 99.7 98.7  PLT 344  269 306 330 315 295   Basic Metabolic Panel: Recent Labs  Lab 07/11/24 1521 07/12/24 0642 07/13/24 0029 07/14/24 0419 07/14/24 1427  NA 137 136 138 142 143  K 3.5 3.9 3.3* 3.9 3.8  CL 98 98 102 107 107  CO2 21* 24 23 22 23   GLUCOSE 152* 120* 100* 93 89  BUN 38* 34* 34* 27* 24*  CREATININE 1.41* 1.29* 1.22 1.12 1.03  CALCIUM 10.4* 9.3 9.2 9.1 8.8*     Scheduled Meds:  apixaban   5 mg Oral BID   DULoxetine   30 mg Oral BID   metoprolol  tartrate  50 mg Oral BID   montelukast   10 mg Oral BH-q7a   pantoprazole  (PROTONIX ) IV  40 mg Intravenous Q24H   sodium chloride  flush  3 mL Intravenous Q12H   sodium chloride  flush  5 mL Intracatheter Q8H   tamsulosin   0.4 mg Oral Daily    Imaging and lab data personally reviewed   Author: Tenya Araque  07/16/2024 2:01 PM  To contact [facility]>   Check the care team in [facility] and look for the attending/consulting [facility] provider listed  Log into [email or web address] and use [place]'s universal password   Go to> [facility]  and find provider  If you still have difficulty reaching the provider, please page the [facility] (Director on Call) for the Hospitalists listed on amion     "

## 2024-07-16 NOTE — Progress Notes (Addendum)
 07/16/2024  Luke Dalton [id number] 1951/04/24  CARE TEAM: PCP: Chrystal Lamarr RAMAN, MD  Outpatient Care Team: Patient Care Team: Chrystal Lamarr RAMAN, MD as PCP - General (Family Medicine) Patel, Donika K, DO as Consulting Physician (Neurology)  Inpatient Treatment Team: Treatment Team:  Rizwan, Saima, MD Ccs, Md, MD Lbcardiology, [place], MD Ruthellen Ruthellen Radiology, MD Bobbette Loupe, MD [employer], [place], RN Remi Omega BRAVO, NT [place], [place], [facility] Jerona Lauraine BROCKS, RN   Problem List:   Principal Problem:   Perforated appendix Active Problems:   Acute appendicitis with perforation, localized peritonitis, and abscess   New onset atrial flutter (HCC)   AKI (acute kidney injury)   Transaminitis   Persistent atrial fibrillation [facility])   Preoperative cardiovascular examination   *Percutaneous drainage by interventional radiology 07/12/2024*    IMPRESSION: Successful CT guided placement of a right transgluteal 10 French all purpose drain catheter into the pelvic abscess with aspiration of 100 mL of purulent fluid. Samples were sent to the laboratory as requested by the ordering clinical team.   Ester Sides, MD  [facility] Radiology       [facility] Stay = 5 days)      Slowly recovering     Plan: Assessment/Plan Acute perforated appendicitis with multiple abscesses and reactive SBO - Cleaning improving.   - Advancing diet.   -cont IV abx therapy.  Apparently switch from Zosyn  to ceftriaxone  given sensitivities yesterday.  Would favor at least 5 days if not 10 depending on outcome. -cont IR drain  Given some persistent abdominal pain with significant complicated multilocular abscesses, will repeat CAT scan today to rule out undrained fluid collections.  If that is improved as we hope, then can work towards transitioning to oral antibiotics and outpatient follow-up.  -if he  continues to improve with conservative management, would likely plan for interval colonoscopy and then probable appendectomy versus ileocolectomy in 6-8 weeks.  Will need medical & cardiac clearance for this surgery plan.   -hopefully can start transitioning towards DC this weekend   FEN -soft  VTE - heparin  gtt ID -piperacillin nadine 1/12-1/16.  Switched to ceftriaxone  1/16-   New onset a fib - now in NSR HTN  I updated the patient's status to the patient  Recommendations were made.  Questions were answered.  He expressed understanding & appreciation.      I reviewed nursing notes, hospitalist notes, last 24 h vitals and pain scores, last 48 h intake and output, last 24 h labs and trends, and last 24 h imaging results.  I have reviewed this patient's available data, including medical history, events of note, test results, etc as part of my evaluation.   A significant portion of that time was spent in counseling. Care during the described time interval was provided by me.  This care required moderate level of medical decision making.  07/16/2024    Subjective: (Chief complaint)  Patient with some right lower quadrant pain but improved overall.  Drinking oral contrast.  No nausea or vomiting.  Objective:  Vital signs:  Vitals:   07/15/24 0640 07/15/24 1445 07/15/24 2025 07/16/24 0511  BP: (!) 144/66 (!) 123/52 (!) 118/59 117/73  Pulse: 67 (!) 59 76 89  Resp: 19 18 18 18   Temp: 97.9 F (36.6 C) 98.6 F (37 C) 98.7 F (37.1 C) 98.5 F (36.9 C)  TempSrc: Oral Oral Oral Oral  SpO2: 98% 100% 100% 98%  Weight:      Height:  Last BM Date : 07/15/24  Intake/Output   Yesterday:  01/16 0701 - 01/17 0700 In: 172.1 [IV Piggyback:172.1] Out: 330 [Urine:300; Drains:30] This shift:  Total I/O In: 120 [P.O.:120] Out: -   Bowel function:  Flatus: YES  BM:  YES  Drain: Thinly purulent   Physical Exam:  General: Pt awake/alert in no acute  distress Eyes: PERRL, normal EOM.  Sclera clear.  No icterus Neuro: CN II-XII intact w/o focal sensory/motor deficits. Lymph: No head/neck/groin lymphadenopathy Psych:  No delerium/psychosis/paranoia.  Oriented x 4 HENT: Normocephalic, Mucus membranes moist.  No thrush Neck: Supple, No tracheal deviation.  No obvious thyromegaly Chest: No pain to chest wall compression.  Good respiratory excursion.  No audible wheezing CV:  Pulses intact.  Regular rhythm.  No major extremity edema MS: Normal AROM mjr joints.  No obvious deformity  Abdomen: Soft.  Mildy distended.  Tenderness at right lower quadrant mild..  No evidence of peritonitis.  No incarcerated hernias.  Ext:   No deformity.  No mjr edema.  No cyanosis Skin: No petechiae / purpurea.  No major sores.  Warm and dry    Results:   Cultures: Recent Results (from the past 720 hours)  Blood culture (routine x 2)     Status: Abnormal   Collection Time: 07/11/24  6:35 PM   Specimen: BLOOD RIGHT FOREARM  Result Value Ref Range Status   Specimen Description   Final    BLOOD RIGHT FOREARM Performed at [facility], [address], [place], [place] [address]    Special Requests   Final    BOTTLES DRAWN AEROBIC AND ANAEROBIC Blood Culture results may not be optimal due to an inadequate volume of blood received in culture bottles Performed at [facility], [address] [place], [place] [address]    Culture  Setup Time   Final    GRAM POSITIVE COCCI IN CLUSTERS ANAEROBIC BOTTLE ONLY CRITICAL RESULT CALLED TO, READ BACK BY AND VERIFIED WITH: PHARMD M LILLISTON 07/13/2024 @ 0415 BY AB    Culture (A)  Final    STAPHYLOCOCCUS CAPITIS THE SIGNIFICANCE OF ISOLATING THIS ORGANISM FROM A SINGLE SET OF BLOOD CULTURES WHEN MULTIPLE SETS ARE DRAWN IS UNCERTAIN. PLEASE NOTIFY THE MICROBIOLOGY DEPARTMENT WITHIN ONE WEEK IF SPECIATION AND SENSITIVITIES ARE REQUIRED. Performed at [facility], [address],  [place], [place] [address]    Report Status 07/15/2024 FINAL  Final  Blood Culture ID Panel (Reflexed)     Status: Abnormal   Collection Time: 07/11/24  6:35 PM  Result Value Ref Range Status   Enterococcus faecalis NOT DETECTED NOT DETECTED Final   Enterococcus Faecium NOT DETECTED NOT DETECTED Final   Listeria monocytogenes NOT DETECTED NOT DETECTED Final   Staphylococcus species DETECTED (A) NOT DETECTED Final    Comment: CRITICAL RESULT CALLED TO, READ BACK BY AND VERIFIED WITH: PHARMD M LILLISTON 07/13/2024 @ 0415 BY AB    Staphylococcus aureus (BCID) NOT DETECTED NOT DETECTED Final   Staphylococcus epidermidis NOT DETECTED NOT DETECTED Final   Staphylococcus lugdunensis NOT DETECTED NOT DETECTED Final   Streptococcus species NOT DETECTED NOT DETECTED Final   Streptococcus agalactiae NOT DETECTED NOT DETECTED Final   Streptococcus pneumoniae NOT DETECTED NOT DETECTED Final   Streptococcus pyogenes NOT DETECTED NOT DETECTED Final   A.calcoaceticus-baumannii NOT DETECTED NOT DETECTED Final   Bacteroides fragilis NOT DETECTED NOT DETECTED Final   Enterobacterales NOT DETECTED NOT DETECTED Final   Enterobacter cloacae complex NOT  DETECTED NOT DETECTED Final   Escherichia coli NOT DETECTED NOT DETECTED Final   Klebsiella aerogenes NOT DETECTED NOT DETECTED Final   Klebsiella oxytoca NOT DETECTED NOT DETECTED Final   Klebsiella pneumoniae NOT DETECTED NOT DETECTED Final   Proteus species NOT DETECTED NOT DETECTED Final   Salmonella species NOT DETECTED NOT DETECTED Final   Serratia marcescens NOT DETECTED NOT DETECTED Final   Haemophilus influenzae NOT DETECTED NOT DETECTED Final   Neisseria meningitidis NOT DETECTED NOT DETECTED Final   Pseudomonas aeruginosa NOT DETECTED NOT DETECTED Final   Stenotrophomonas maltophilia NOT DETECTED NOT DETECTED Final   Candida albicans NOT DETECTED NOT DETECTED Final   Candida auris NOT DETECTED NOT DETECTED Final   Candida glabrata NOT DETECTED NOT  DETECTED Final   Candida krusei NOT DETECTED NOT DETECTED Final   Candida parapsilosis NOT DETECTED NOT DETECTED Final   Candida tropicalis NOT DETECTED NOT DETECTED Final   Cryptococcus neoformans/gattii NOT DETECTED NOT DETECTED Final    Comment: Performed at [facility], [address], [place], [place] [address]  Blood culture (routine x 2)     Status: Abnormal   Collection Time: 07/11/24  6:40 PM   Specimen: BLOOD RIGHT WRIST  Result Value Ref Range Status   Specimen Description   Final    BLOOD RIGHT WRIST Performed at [facility], [address], [place], [place] [address]    Special Requests   Final    BOTTLES DRAWN AEROBIC AND ANAEROBIC Blood Culture results may not be optimal due to an inadequate volume of blood received in culture bottles Performed at [facility], [address] [place], [place] [address]    Culture  Setup Time   Final    GRAM NEGATIVE RODS ANAEROBIC BOTTLE ONLY CRITICAL RESULT CALLED TO, READ BACK BY AND VERIFIED WITH:  MITCHELL LILLISTON PHARMD 07/14/2024 BY DD @ 0210    Culture (A)  Final    BACTEROIDES FRAGILIS BETA LACTAMASE POSITIVE Performed at [facility], [address] [place], [place] [address]    Report Status 07/16/2024 FINAL  Final  Blood Culture ID Panel (Reflexed)     Status: Abnormal   Collection Time: 07/11/24  6:40 PM  Result Value Ref Range Status   Enterococcus faecalis NOT DETECTED NOT DETECTED Final   Enterococcus Faecium NOT DETECTED NOT DETECTED Final   Listeria monocytogenes NOT DETECTED NOT DETECTED Final   Staphylococcus species NOT DETECTED NOT DETECTED Final   Staphylococcus aureus (BCID) NOT DETECTED NOT DETECTED Final   Staphylococcus epidermidis NOT DETECTED NOT DETECTED Final   Staphylococcus lugdunensis NOT DETECTED NOT DETECTED Final   Streptococcus species NOT DETECTED NOT DETECTED Final   Streptococcus agalactiae NOT DETECTED NOT DETECTED Final   Streptococcus  pneumoniae NOT DETECTED NOT DETECTED Final   Streptococcus pyogenes NOT DETECTED NOT DETECTED Final   A.calcoaceticus-baumannii NOT DETECTED NOT DETECTED Final   Bacteroides fragilis DETECTED (A) NOT DETECTED Final    Comment: CRITICAL RESULT CALLED TO, READ BACK BY AND VERIFIED WITH:  MITCHELL LILLISTON PHARMD 07/14/2024 BY DD @ 0210    Enterobacterales NOT DETECTED NOT DETECTED Final   Enterobacter cloacae complex NOT DETECTED NOT DETECTED Final   Escherichia coli NOT DETECTED NOT DETECTED Final   Klebsiella aerogenes NOT DETECTED NOT DETECTED Final   Klebsiella oxytoca NOT DETECTED NOT DETECTED Final   Klebsiella pneumoniae NOT DETECTED NOT DETECTED Final   Proteus species NOT DETECTED NOT DETECTED Final   Salmonella species NOT  DETECTED NOT DETECTED Final   Serratia marcescens NOT DETECTED NOT DETECTED Final   Haemophilus influenzae NOT DETECTED NOT DETECTED Final   Neisseria meningitidis NOT DETECTED NOT DETECTED Final   Pseudomonas aeruginosa NOT DETECTED NOT DETECTED Final   Stenotrophomonas maltophilia NOT DETECTED NOT DETECTED Final   Candida albicans NOT DETECTED NOT DETECTED Final   Candida auris NOT DETECTED NOT DETECTED Final   Candida glabrata NOT DETECTED NOT DETECTED Final   Candida krusei NOT DETECTED NOT DETECTED Final   Candida parapsilosis NOT DETECTED NOT DETECTED Final   Candida tropicalis NOT DETECTED NOT DETECTED Final   Cryptococcus neoformans/gattii NOT DETECTED NOT DETECTED Final    Comment: Performed at [facility], [address], [place], [place] [address]  Aerobic/Anaerobic Culture w Gram Stain (surgical/deep wound)     Status: None   Collection Time: 07/12/24  3:03 PM   Specimen: Abscess  Result Value Ref Range Status   Specimen Description   Final    ABSCESS RIGHT PELVIS Performed at [facility], [address], [place], [place] [address]    Special Requests   Final    NONE Performed at [facility],  [address], [place], [place] [address]    Gram Stain   Final    ABUNDANT WBC PRESENT, PREDOMINANTLY PMN ABUNDANT GRAM VARIABLE ROD    Culture   Final    ABUNDANT ESCHERICHIA COLI ABUNDANT BACTEROIDES FRAGILIS BETA LACTAMASE POSITIVE Performed at [facility], [address], [place], [place] [address]    Report Status 07/15/2024 FINAL  Final   Organism ID, Bacteria ESCHERICHIA COLI  Final      Susceptibility   Escherichia coli - MIC*    AMPICILLIN >=32 RESISTANT Resistant     CEFAZOLIN  (NON-URINE) 4 INTERMEDIATE Intermediate     CEFEPIME <=0.12 SENSITIVE Sensitive     ERTAPENEM <=0.12 SENSITIVE Sensitive     CEFTRIAXONE  <=0.25 SENSITIVE Sensitive     CIPROFLOXACIN  <=0.06 SENSITIVE Sensitive     GENTAMICIN <=1 SENSITIVE Sensitive     MEROPENEM <=0.25 SENSITIVE Sensitive     TRIMETH/SULFA <=20 SENSITIVE Sensitive     AMPICILLIN/SULBACTAM 16 INTERMEDIATE Intermediate     PIP/TAZO Value in next row Sensitive      <=4 SENSITIVEThis is a modified FDA-approved test that has been validated and its performance characteristics determined by the reporting laboratory.  This laboratory is certified under the Clinical Laboratory Improvement Amendments CLIA as qualified to perform high complexity clinical laboratory testing.    * ABUNDANT ESCHERICHIA COLI    Labs: Results for orders placed or performed during the hospital encounter of 07/11/24 (from the past 48 hours)  Comprehensive metabolic panel     Status: Abnormal   Collection Time: 07/14/24  2:27 PM  Result Value Ref Range   Sodium 143 135 - 145 mmol/L   Potassium 3.8 3.5 - 5.1 mmol/L   Chloride 107 98 - 111 mmol/L   CO2 23 22 - 32 mmol/L   Glucose, Bld 89 70 - 99 mg/dL    Comment: Glucose reference range applies only to samples taken after fasting for at least 8 hours.   BUN 24 (H) 8 - 23 mg/dL   Creatinine, Ser 8.96 0.61 - 1.24 mg/dL   Calcium 8.8 (L) 8.9 - 10.3 mg/dL   Total Protein 6.1 (L) 6.5 - 8.1 g/dL   Albumin  3.1 (L) 3.5 - 5.0 g/dL   AST 31 15 - 41 U/L   ALT  55 (H) 0 - 44 U/L   Alkaline Phosphatase 79 38 - 126 U/L   Total Bilirubin 1.1 0.0 - 1.2 mg/dL   GFR, Estimated >39 >39 mL/min    Comment: (NOTE) Calculated using the CKD-EPI Creatinine Equation (2021)    Anion gap 13 5 - 15    Comment: Performed at [facility], [address], [place], [place] [address]  CBC     Status: Abnormal   Collection Time: 07/15/24  4:04 AM  Result Value Ref Range   WBC 10.4 4.0 - 10.5 K/uL   RBC 3.63 (L) 4.22 - 5.81 MIL/uL   Hemoglobin 11.8 (L) 13.0 - 17.0 g/dL   HCT 63.7 (L) 60.9 - 47.9 %   MCV 99.7 80.0 - 100.0 fL   MCH 32.5 26.0 - 34.0 pg   MCHC 32.6 30.0 - 36.0 g/dL   RDW 86.7 88.4 - 84.4 %   Platelets 315 150 - 400 K/uL   nRBC 0.0 0.0 - 0.2 %    Comment: Performed at [facility], [address] [place], [place] [address]  CBC     Status: Abnormal   Collection Time: 07/16/24  3:56 AM  Result Value Ref Range   WBC 11.0 (H) 4.0 - 10.5 K/uL   RBC 3.92 (L) 4.22 - 5.81 MIL/uL   Hemoglobin 12.8 (L) 13.0 - 17.0 g/dL   HCT 61.2 (L) 60.9 - 47.9 %   MCV 98.7 80.0 - 100.0 fL   MCH 32.7 26.0 - 34.0 pg   MCHC 33.1 30.0 - 36.0 g/dL   RDW 87.2 88.4 - 84.4 %   Platelets 295 150 - 400 K/uL   nRBC 0.0 0.0 - 0.2 %    Comment: Performed at [facility], [address] [place], [place] [address]    Imaging / Studies: No results found.  Medications / Allergies: per chart  Antibiotics: Anti-infectives (From admission, onward)    Start     Dose/Rate Route Frequency Ordered Stop   07/15/24 1600  cefTRIAXone  (ROCEPHIN ) 2 g in sodium chloride  0.9 % 100 mL IVPB        2 g 200 mL/hr over 30 Minutes Intravenous Every 24 hours 07/15/24 1507     07/15/24 1600  metroNIDAZOLE  (FLAGYL ) tablet 500 mg        500 mg Oral Every 8 hours 07/15/24 1507     07/12/24 0300  piperacillin -tazobactam (ZOSYN ) IVPB 3.375 g  Status:  Discontinued        3.375 g 12.5  mL/hr over 240 Minutes Intravenous Every 8 hours 07/11/24 1842 07/11/24 1843   07/12/24 0100  piperacillin -tazobactam (ZOSYN ) IVPB 3.375 g  Status:  Discontinued        3.375 g 12.5 mL/hr over 240 Minutes Intravenous Every 8 hours 07/11/24 1843 07/15/24 1507   07/11/24 1815  piperacillin -tazobactam (ZOSYN ) IVPB 3.375 g        3.375 g 100 mL/hr over 30 Minutes Intravenous  Once 07/11/24 1814 07/11/24 1946         Note: Portions of this report may have been transcribed using voice recognition software. Every effort was made to ensure accuracy; however, inadvertent computerized transcription errors may be present.   Any transcriptional errors that result from this process are unintentional.    Elspeth KYM Schultze, MD, FACS, MASCRS Esophageal, Gastrointestinal & Colorectal Surgery Robotic and Minimally Invasive Surgery  [facility] [address] [place], [place]  [address] [phone] Fax [phone] Main  CONTACT INFORMATION: Weekday (9AM-5PM): Call [facility] main office at [phone] Weeknight (5PM-9AM) or Weekend/Holiday: Check EPIC Web Links tab & use AMION (password  [email or web address]) for General Surgery [facility] coverage  Please, DO NOT use SecureChat  (it is not reliable communication to reach operating surgeons & will lead to a delay in care).   Epic staff messaging available for outpatient concerns needing 1-2 business day response.      07/16/2024  8:55 AM

## 2024-07-16 NOTE — Progress Notes (Addendum)
 "   Referring Physician(s): Gross,S  Supervising Physician: Jennefer Rover  Patient Status:  [facility] - In-pt  Chief Complaint: Right lower abdominal pain, perforated appendicitis with intra-abdominal abscesses; status post right pelvic abscess drain via transgluteal approach on 07/12/24   Subjective: Patient states that at approximately 3 AM this morning he felt a pop in his right lower abdomen with subsequent pain; continues to have some right lower quadrant pain at this time;  denies fever, respiratory issues, nausea, vomiting   Allergies: Losartan  Medications: Prior to Admission medications  Medication Sig Start Date End Date Taking? Authorizing Provider  Ascorbic Acid (VITAMIN C) 1000 MG tablet Take 1,000 mg by mouth daily.   Yes [provider]  Cholecalciferol (VITAMIN D3) 50 MCG (2000 UT) TABS Take 1 tablet by mouth daily.   Yes [provider]  cyanocobalamin  (VITAMIN B12) 500 MCG tablet Take 500 mcg by mouth daily.   Yes [provider]  DULoxetine  (CYMBALTA ) 30 MG capsule Take 1 capsule (30 mg total) by mouth 2 (two) times daily. 07/06/24  Yes Patel, Donika K, DO  famotidine (PEPCID) 40 MG tablet Take 40 mg by mouth as needed for heartburn.   Yes [provider]  losartan (COZAAR) 25 MG tablet Take 25 mg by mouth daily.   Yes [provider]  montelukast  (SINGULAIR ) 10 MG tablet Take 10 mg by mouth every morning.   Yes [provider]  Omega-3 Fatty Acids (FISH OIL PO) Take 1 capsule by mouth daily.   Yes [provider]  simvastatin  (ZOCOR ) 40 MG tablet Take 40 mg by mouth every other day. Patient taking differently: Take 20 mg by mouth daily.   Yes [provider]  tamsulosin  (FLOMAX ) 0.4 MG CAPS capsule Take 0.4 mg by mouth every other day.   Yes [provider]     Vital Signs: BP 117/73 (BP Location: Right Arm)   Pulse 89   Temp 98.5 F (36.9 C) (Oral)   Resp 18   Ht 5' 10 (1.778 m)    Wt 186 lb 8.2 oz (84.6 kg)   SpO2 98%   BMI 26.76 kg/m   Physical Exam awake, alert.  Chest clear to auscultation bilaterally.  Heart with RRR;   Abdomen soft, positive bowel sounds, mild- mod tender right lower quadrant to palpation.  No lower extremity edema.  Right TG drain in place but suture currently out of skin.  Small amount of light yellow fluid in JP bulb.  Drain flushed but no significant return. A statlock device and new gauze dressing was applied over drain insertion site.  Imaging: DG Abd Portable 1V Result Date: 07/13/2024 CLINICAL DATA:  Small bowel obstruction. EXAM: PORTABLE ABDOMEN - 1 VIEW COMPARISON:  Abdominal radiograph dated 07/12/2024. FINDINGS: Partially visualized enteric tube with tip in the proximal stomach and side-port in the region of the GE junction. Recommend further advancing by additional 5 cm. Diffusely dilated small bowel loops measure up to 5 cm in caliber. A pigtail catheter noted over the pelvis. Degenerative changes of the spine. No acute osseous pathology. IMPRESSION: 1. Persistent small bowel obstruction. 2. Recommend further advancing the enteric tube by additional 5 cm. Electronically Signed   By: Vanetta Chou M.D.   On: 07/13/2024 13:40   ECHOCARDIOGRAM COMPLETE Result Date: 07/13/2024    ECHOCARDIOGRAM REPORT   Patient Name:   Luke Dalton Date of Exam: 07/12/2024 Medical Rec #:  [id number]        Height:  70.0 in Accession #:    [id number]       Weight:       186.5 lb Date of Birth:  January 06, 1951        BSA:          2.026 m Patient Age:    74 years         BP:           113/66 mmHg Patient Gender: M                HR:           95 bpm. Exam Location:  Inpatient Procedure: 2D Echo, Cardiac Doppler and Color Doppler (Both Spectral and Color            Flow Doppler were utilized during procedure). Indications:     Abnormal ECG 194.31  History:         Patient has no prior history of Echocardiogram examinations.                  Risk  Factors:Hypertension.  Sonographer:     Merlynn Argyle Referring Phys:  [id number] SUMAYYA AMIN Diagnosing Phys: Darryle Decent MD  Sonographer Comments: Image acquisition challenging due to respiratory motion. IMPRESSIONS  1. Left ventricular ejection fraction, by estimation, is 60 to 65%. The left ventricle has normal function. The left ventricle has no regional wall motion abnormalities. Indeterminate diastolic filling due to E-A fusion.  2. Right ventricular systolic function is normal. The right ventricular size is normal. There is normal pulmonary artery systolic pressure. The estimated right ventricular systolic pressure is 22.4 mmHg.  3. The mitral valve is grossly normal. Trivial mitral valve regurgitation. No evidence of mitral stenosis.  4. The aortic valve is tricuspid. Aortic valve regurgitation is mild. No aortic stenosis is present.  5. The inferior vena cava is normal in size with greater than 50% respiratory variability, suggesting right atrial pressure of 3 mmHg. FINDINGS  Left Ventricle: Left ventricular ejection fraction, by estimation, is 60 to 65%. The left ventricle has normal function. The left ventricle has no regional wall motion abnormalities. The left ventricular internal cavity size was normal in size. There is  no left ventricular hypertrophy. Indeterminate diastolic filling due to E-A fusion. Right Ventricle: The right ventricular size is normal. No increase in right ventricular wall thickness. Right ventricular systolic function is normal. There is normal pulmonary artery systolic pressure. The tricuspid regurgitant velocity is 2.20 m/s, and  with an assumed right atrial pressure of 3 mmHg, the estimated right ventricular systolic pressure is 22.4 mmHg. Left Atrium: Left atrial size was normal in size. Right Atrium: Right atrial size was normal in size. Pericardium: Trivial pericardial effusion is present. Mitral Valve: The mitral valve is grossly normal. Trivial mitral valve regurgitation.  No evidence of mitral valve stenosis. Tricuspid Valve: The tricuspid valve is grossly normal. Tricuspid valve regurgitation is trivial. No evidence of tricuspid stenosis. Aortic Valve: The aortic valve is tricuspid. Aortic valve regurgitation is mild. No aortic stenosis is present. Pulmonic Valve: The pulmonic valve was grossly normal. Pulmonic valve regurgitation is trivial. No evidence of pulmonic stenosis. Aorta: The aortic root and ascending aorta are structurally normal, with no evidence of dilitation. Venous: The inferior vena cava is normal in size with greater than 50% respiratory variability, suggesting right atrial pressure of 3 mmHg. IAS/Shunts: The atrial septum is grossly normal.  LEFT VENTRICLE PLAX 2D LVIDd:         4.00 cm  LVIDs:         3.10 cm LV PW:         1.00 cm LV IVS:        1.10 cm  RIGHT VENTRICLE          IVC RV Basal diam:  3.60 cm  IVC diam: 1.00 cm TAPSE (M-mode): 2.6 cm LEFT ATRIUM             Index        RIGHT ATRIUM           Index LA diam:        4.30 cm 2.12 cm/m   RA Area:     14.80 cm LA Vol (A2C):   35.4 ml 17.47 ml/m  RA Volume:   29.80 ml  14.71 ml/m LA Vol (A4C):   33.2 ml 16.38 ml/m LA Biplane Vol: 35.0 ml 17.27 ml/m  AORTIC VALVE             PULMONIC VALVE LVOT Vmax:   64.00 cm/s  PR End Diast Vel: 6.15 msec LVOT Vmean:  46.200 cm/s LVOT VTI:    0.117 m  AORTA Ao Root diam: 3.50 cm Ao Asc diam:  3.80 cm TRICUSPID VALVE TR Peak grad:   19.4 mmHg TR Vmax:        220.00 cm/s  SHUNTS Systemic VTI: 0.12 m Darryle Decent MD Electronically signed by Darryle Decent MD Signature Date/Time: 07/12/2024/2:09:26 PM    Final (Updated)    CT GUIDED PERITONEAL/RETROPERITONEAL FLUID DRAIN BY PERC CATH Result Date: 07/12/2024 INDICATION: 74 year old male with history of perforated appendicitis complicated by pelvic abscess formation. EXAM: CT PERC DRAIN PERITONEAL ABCESS COMPARISON:  07/11/2024 MEDICATIONS: The patient is currently admitted to the hospital and receiving intravenous  antibiotics. The antibiotics were administered within an appropriate time frame prior to the initiation of the procedure. ANESTHESIA/SEDATION: Moderate (conscious) sedation was employed during this procedure. A total of Versed  1 mg and Fentanyl  50 mcg was administered intravenously. Moderate Sedation Time: 13 minutes. The patient's level of consciousness and vital signs were monitored continuously by radiology nursing throughout the procedure under my direct supervision. CONTRAST:  None COMPLICATIONS: None immediate. PROCEDURE: RADIATION DOSE REDUCTION: This exam was performed according to the departmental dose-optimization program which includes automated exposure control, adjustment of the mA and/or kV according to patient size and/or use of iterative reconstruction technique. Informed written consent was obtained from the patient after a discussion of the risks, benefits and alternatives to treatment. The patient was placed prone on the CT gantry and a pre procedural CT was performed re-demonstrating the known abscess/fluid collection within the pelvis. The procedure was planned. A timeout was performed prior to the initiation of the procedure. The right trans gluteal region was prepped and draped in the usual sterile fashion. The overlying soft tissues were anesthetized with 1% lidocaine  with epinephrine. Appropriate trajectory was planned with the use of a 22 gauge spinal needle. An 18 gauge trocar needle was advanced into the abscess/fluid collection and a short Amplatz super stiff wire was coiled within the collection. Appropriate positioning was confirmed with a limited CT scan. The tract was serially dilated allowing placement of a 10 French all-purpose drainage catheter. Appropriate positioning was confirmed with a limited postprocedural CT scan. Approximately 100 ml of purulent fluid was aspirated. The tube was connected to a bulb suction and sutured in place. A dressing was placed. The patient tolerated  the procedure well without immediate post procedural complication. IMPRESSION: Successful CT  guided placement of a right transgluteal 10 French all purpose drain catheter into the pelvic abscess with aspiration of 100 mL of purulent fluid. Samples were sent to the laboratory as requested by the ordering clinical team. Ester Sides, MD [facility] Electronically Signed   By: Ester Sides M.D.   On: 07/12/2024 15:23   DG Abd Portable 1V Result Date: 07/12/2024 CLINICAL DATA:  Nasogastric tube placement. EXAM: PORTABLE ABDOMEN - 1 VIEW COMPARISON:  Yesterday FINDINGS: Nasogastric tube tip is seen in proximal stomach. Small bowel dilatation is noted concerning for distal small bowel obstruction. IMPRESSION: Nasogastric tube tip seen in proximal stomach. Small bowel dilatation is noted concerning for distal small bowel obstruction. Electronically Signed   By: Lynwood Landy Raddle M.D.   On: 07/12/2024 12:46    Labs:  CBC: Recent Labs    07/13/24 0029 07/14/24 0419 07/15/24 0404 07/16/24 0356  WBC 11.8* 11.1* 10.4 11.0*  HGB 14.8 14.0 11.8* 12.8*  HCT 42.6 42.1 36.2* 38.7*  PLT 306 330 315 295    COAGS: Recent Labs    07/12/24 1130  INR 1.1    BMP: Recent Labs    07/12/24 0642 07/13/24 0029 07/14/24 0419 07/14/24 1427  NA 136 138 142 143  K 3.9 3.3* 3.9 3.8  CL 98 102 107 107  CO2 24 23 22 23   GLUCOSE 120* 100* 93 89  BUN 34* 34* 27* 24*  CALCIUM 9.3 9.2 9.1 8.8*  CREATININE 1.29* 1.22 1.12 1.03  GFRNONAA 58* >60 >60 >60    LIVER FUNCTION TESTS: Recent Labs    07/11/24 1521 07/13/24 0029 07/14/24 0419 07/14/24 1427  BILITOT 1.3* 1.4* 1.1 1.1  AST 102* 49* 31 31  ALT 128* 92* 60* 55*  ALKPHOS 102 85 74 79  PROT 7.8 6.6 6.4* 6.1*  ALBUMIN 3.8 3.2* 3.2* 3.1*    Assessment and Plan: 74 y.o. male ex-smoker with past medical history significant for arthritis, vitamin B12 deficiency, hyperlipidemia, GERD,  hypertension, spinal stenosis, alcohol induced neuropathy, lumbosacral spondylosis, vitamin D deficiency , now with perforated appendicitis with multiple intra-abdominal abscesses, status post right pelvic abscess drain via transgluteal approach on 1/13 (10 fr to JP); afebrile, WBC 11 up slightly from 10.4, hemoglobin stable, drain fluid cultures with E. coli and Bacteroides fragilis; prelim findings on today's CT reveal that catheter is in good position, pelvic collection resolved it appears; some persistent small collections in RLQ, slightly smaller than prior . Continue current tx for now; if drain output continues to decline rec drain injection early next week before considering drain removal; pt updated   Electronically Signed: D. Franky Rakers, PA-C 07/16/2024, 10:16 AM   I spent a total of 15 Minutes at the the patient's bedside AND on the patient's hospital floor or unit, greater than 50% of which was counseling/coordinating care for right pelvic abscess drain    Patient ID: Luke Dalton, male   DOB: Nov 11, 1950, 74 y.o.   MRN: [id number]  "

## 2024-07-16 NOTE — Progress Notes (Signed)
 Pt complains of tenderness to R. lower abd. In his own word, he thinks the appendix has open up. Refuses any pain med and says is only painful on palpation. NP made aware. Vs stable.

## 2024-07-16 NOTE — Progress Notes (Signed)
" ° ° °  Maintaining NSR.     No plans for surgery at this time. We will follow from a distance.   We will arrange follow up an monitor at discharge.  Please call or secure chat before discharge.    "

## 2024-07-16 NOTE — Progress Notes (Signed)
 CT scan repeat today shows pelvic abscess well drained  HOWEVER, RLQ abscess persists.  5cm   Pt with pain persistent - Remaining needs to be drained to be able to plan appendectomy vs ileocectomy in 6-8 weeks Order for drain placement made for tomorrow

## 2024-07-17 DIAGNOSIS — I480 Paroxysmal atrial fibrillation: Secondary | ICD-10-CM

## 2024-07-17 DIAGNOSIS — K3532 Acute appendicitis with perforation and localized peritonitis, without abscess: Secondary | ICD-10-CM | POA: Diagnosis not present

## 2024-07-17 LAB — CBC
HCT: 38.9 % — ABNORMAL LOW (ref 39.0–52.0)
Hemoglobin: 12.9 g/dL — ABNORMAL LOW (ref 13.0–17.0)
MCH: 32.4 pg (ref 26.0–34.0)
MCHC: 33.2 g/dL (ref 30.0–36.0)
MCV: 97.7 fL (ref 80.0–100.0)
Platelets: 289 K/uL (ref 150–400)
RBC: 3.98 MIL/uL — ABNORMAL LOW (ref 4.22–5.81)
RDW: 12.5 % (ref 11.5–15.5)
WBC: 11.7 K/uL — ABNORMAL HIGH (ref 4.0–10.5)
nRBC: 0 % (ref 0.0–0.2)

## 2024-07-17 LAB — APTT: aPTT: 96 s — ABNORMAL HIGH (ref 24–36)

## 2024-07-17 MED ORDER — HEPARIN (PORCINE) 25000 UT/250ML-% IV SOLN
1700.0000 [IU]/h | INTRAVENOUS | Status: AC
  Administered 2024-07-17: 1700 [IU]/h via INTRAVENOUS
  Filled 2024-07-17: qty 250

## 2024-07-17 MED ORDER — ALUM & MAG HYDROXIDE-SIMETH 200-200-20 MG/5ML PO SUSP
30.0000 mL | ORAL | Status: DC | PRN
  Administered 2024-07-17: 30 mL via ORAL
  Filled 2024-07-17: qty 30

## 2024-07-17 MED ORDER — AMOXICILLIN-POT CLAVULANATE 875-125 MG PO TABS: 1.0000 | ORAL_TABLET | Freq: Two times a day (BID) | ORAL | 1 refills | Status: DC

## 2024-07-17 NOTE — Progress Notes (Signed)
 "   Referring Physician(s): Gross,S  Supervising Physician: Jennefer Rover  Patient Status:  [facility] - In-pt  Chief Complaint: Right lower abdominal pain, perforated appendicitis with intra-abdominal abscesses; status post right pelvic abscess drain via transgluteal approach on 07/12/24    Subjective: Pt doing ok this am; has been ambulating in hallway ; denies fever, HA,CP,dyspnea, cough, back pain,N/V or bleeding; still has some mild RLQ discomfort primarily with movement; no BM   Allergies: Losartan  Medications: Prior to Admission medications  Medication Sig Start Date End Date Taking? Authorizing Provider  Ascorbic Acid (VITAMIN C) 1000 MG tablet Take 1,000 mg by mouth daily.   Yes [provider]  Cholecalciferol (VITAMIN D3) 50 MCG (2000 UT) TABS Take 1 tablet by mouth daily.   Yes [provider]  cyanocobalamin  (VITAMIN B12) 500 MCG tablet Take 500 mcg by mouth daily.   Yes [provider]  DULoxetine  (CYMBALTA ) 30 MG capsule Take 1 capsule (30 mg total) by mouth 2 (two) times daily. 07/06/24  Yes Patel, Donika K, DO  famotidine (PEPCID) 40 MG tablet Take 40 mg by mouth as needed for heartburn.   Yes [provider]  losartan (COZAAR) 25 MG tablet Take 25 mg by mouth daily.   Yes [provider]  montelukast  (SINGULAIR ) 10 MG tablet Take 10 mg by mouth every morning.   Yes [provider]  Omega-3 Fatty Acids (FISH OIL PO) Take 1 capsule by mouth daily.   Yes [provider]  simvastatin  (ZOCOR ) 40 MG tablet Take 40 mg by mouth every other day. Patient taking differently: Take 20 mg by mouth daily.   Yes [provider]  tamsulosin  (FLOMAX ) 0.4 MG CAPS capsule Take 0.4 mg by mouth every other day.   Yes [provider]     Vital Signs: BP 130/63   Pulse 68   Temp 98.2 F (36.8 C)   Resp 18   Ht 5' 10 (1.778 m)   Wt 186 lb 8.2 oz (84.6 kg)   SpO2 99%   BMI 26.76 kg/m   Physical Exam  awake/alert; chest- CTA bilat; heart- RRR; abd-soft,+BS, mildly tender RLQ to palpation, rt TG drain intact, dressing clean and dry, site mildly tender to palpation, drain flushed with no sig return; small amount light yellow fluid with fibrous tissue fragments in JP bulb  Imaging: CT ABDOMEN PELVIS W CONTRAST Result Date: 07/16/2024 EXAM: CT ABDOMEN AND PELVIS WITH CONTRAST 07/16/2024 11:05:33 AM TECHNIQUE: CT of the abdomen and pelvis was performed with the administration of 100 mL of iohexol  (OMNIPAQUE ) 300 MG/ML solution. Multiplanar reformatted images are provided for review. Automated exposure control, iterative reconstruction, and/or weight-based adjustment of the mA/kV was utilized to reduce the radiation dose to as low as reasonably achievable. COMPARISON: CT 07/11/2024. CLINICAL HISTORY: Intra-abdominal abscess/fluid retention evaluation; Right lower abdominal pain, perforated appendicitis with intra-abdominal abscesses; status post right pelvic abscess drain via transgluteal approach on 07/12/2024. FINDINGS: LOWER CHEST: No acute abnormality. LIVER: The liver is unremarkable. GALLBLADDER AND BILE DUCTS: Gallbladder is unremarkable. No biliary ductal dilatation. SPLEEN: No acute abnormality. PANCREAS: No acute abnormality. ADRENAL GLANDS: No acute abnormality. KIDNEYS, URETERS AND BLADDER: No stones in the kidneys or ureters. No hydronephrosis. No perinephric or periureteral stranding. Urinary bladder is unremarkable. GI AND BOWEL: Stomach demonstrates no acute abnormality. The appendix is not well identified. There is probable small abscess at the tip of the cecum measuring 4.7 x 2.9 cm. Oral contrast transients the entirety of the small bowel  into the ascending colon. No evidence of bowel obstruction or perforation. Some secondary inflammation of the sigmoid colon related to the perforation. PERITONEUM AND RETROPERITONEUM: Interval placement of a transgluteal mucous catheter. There is near complete  resolution of the fluid collection previously seen in the posterior cul de sac anterior to the rectum. Several smaller collections remain in the right lower quadrant. Mild enhancement collection along the right iliac vessels measures 3.2 x 2.4 cm, slight increase from 3.0 x 1.8 cm. There is interval decrease in inflammation adjacent to the supraspinatus tip. No free air. VASCULATURE: Aorta is normal in caliber. LYMPH NODES: No lymphadenopathy. REPRODUCTIVE ORGANS: No acute abnormality. BONES AND SOFT TISSUES: No acute osseous abnormality. No focal soft tissue abnormality. IMPRESSION: 1. Improvement in inflammatory / infectious process in the right lobe quadrant (perforated appendicitis) 2. Near complete resolution of the fluid collection previously seen in the posterior cul de sac anterior to the rectum following interval transgluteal drain placement. 3. Several smaller right lower quadrant collections persist, including a collection along the right iliac vessels measuring 3.2 x 2.4 cm (slightly increased from 3.0 x 1.8 cm) and a probable small abscess at the tip of the cecum measuring 4.7 x 2.9 cm. 4. No evidence of bowel obstruction or perforation. Electronically signed by: Norleen Boxer MD 07/16/2024 03:54 PM EST RP Workstation: [id number]   DG Abd Portable 1V Result Date: 07/13/2024 CLINICAL DATA:  Small bowel obstruction. EXAM: PORTABLE ABDOMEN - 1 VIEW COMPARISON:  Abdominal radiograph dated 07/12/2024. FINDINGS: Partially visualized enteric tube with tip in the proximal stomach and side-port in the region of the GE junction. Recommend further advancing by additional 5 cm. Diffusely dilated small bowel loops measure up to 5 cm in caliber. A pigtail catheter noted over the pelvis. Degenerative changes of the spine. No acute osseous pathology. IMPRESSION: 1. Persistent small bowel obstruction. 2. Recommend further advancing the enteric tube by additional 5 cm. Electronically Signed   By: Vanetta Chou M.D.    On: 07/13/2024 13:40    Labs:  CBC: Recent Labs    07/14/24 0419 07/15/24 0404 07/16/24 0356 07/17/24 0408  WBC 11.1* 10.4 11.0* 11.7*  HGB 14.0 11.8* 12.8* 12.9*  HCT 42.1 36.2* 38.7* 38.9*  PLT 330 315 295 289    COAGS: Recent Labs    07/12/24 1130  INR 1.1    BMP: Recent Labs    07/12/24 0642 07/13/24 0029 07/14/24 0419 07/14/24 1427  NA 136 138 142 143  K 3.9 3.3* 3.9 3.8  CL 98 102 107 107  CO2 24 23 22 23   GLUCOSE 120* 100* 93 89  BUN 34* 34* 27* 24*  CALCIUM 9.3 9.2 9.1 8.8*  CREATININE 1.29* 1.22 1.12 1.03  GFRNONAA 58* >60 >60 >60    LIVER FUNCTION TESTS: Recent Labs    07/11/24 1521 07/13/24 0029 07/14/24 0419 07/14/24 1427  BILITOT 1.3* 1.4* 1.1 1.1  AST 102* 49* 31 31  ALT 128* 92* 60* 55*  ALKPHOS 102 85 74 79  PROT 7.8 6.6 6.4* 6.1*  ALBUMIN 3.8 3.2* 3.2* 3.1*    Assessment and Plan: 74 y.o. male ex-smoker with past medical history significant for arthritis, vitamin B12 deficiency, hyperlipidemia, GERD, hypertension, spinal stenosis, alcohol induced neuropathy, lumbosacral spondylosis, vitamin D deficiency , now with perforated appendicitis with multiple intra-abdominal abscesses, status post right pelvic abscess drain via transgluteal approach on 1/13 (10 fr to JP); afebrile, WBC 11.7(11), hgb stable; CT A/P yesterday revealed:   1. Improvement in inflammatory /  infectious process in the right lobe quadrant (perforated appendicitis) 2. Near complete resolution of the fluid collection previously seen in the posterior cul de sac anterior to the rectum following interval transgluteal drain placement. 3. Several smaller right lower quadrant collections persist, including a collection along the right iliac vessels measuring 3.2 x 2.4 cm (slightly increased from 3.0 x 1.8 cm) and a probable small abscess at the tip of the cecum measuring 4.7 x 2.9 cm. 4. No evidence of bowel obstruction or perforation  Images were reviewed by Dr.  Jennefer. Request received from Dr. Sheldon for additional drain placement in residual RLQ  fluid collection. Risks and benefits discussed with the patient including bleeding, infection, damage to adjacent structures, bowel perforation/fistula connection, and sepsis. Continue current tx for now along with lab checks  All of the patient's questions were answered, patient is agreeable to proceed. Consent signed and in chart.   Plan to proceed with case on 1/19  Electronically Signed: D. Franky Rakers, PA-C 07/17/2024, 8:59 AM   I spent a total of 20 minutes at the the patient's bedside AND on the patient's hospital floor or unit, greater than 50% of which was counseling/coordinating care for pelvic abscess drain    Patient ID: Luke Dalton, male   DOB: 11-17-1950, 74 y.o.   MRN: [id number]  "

## 2024-07-17 NOTE — Progress Notes (Signed)
 " [facility] Progress Note  Patient: Luke Dalton     FMW:[id number]  DOA: 07/11/2024   PCP: Chrystal Lamarr RAMAN, MD       Brief hospital course: 74 y.o. male with PMH of f hypertension, dyslipidemia, BPH and spinal stenosis s/p of steroid injection a week ago came to ED abdominal pain RLQ sicne Wednesday 07/06/24 with associated nausea, some diarrhea.  The patient states that the pain was on and off on Wednesday and seem to improve with heat and ice.  However, on the following day the pain  became severe and constant.  He was following up with his neurosurgeon on Monday who evaluated him and recommended that he go to the emergency department to have his appendix assessed.  In ED A-fib/flutter (new) WBC 20.2 Cr 1.41 Bicarb 21 with AG of 18 Mildly elevated LFTs U sp gravity> 1.046  CT a/p Perforated appendix with abd/ pelvic abscesses, some SBO 1/13- transgluteal drain by IR- 150 cc of purulent fluid  Subjective:  Less pain RLQ.   Assessment and Plan: Principal Problem:   Perforated appendix resulting in multiple intra-abdominal abscesses, reactive SBO - general surgery managing - R gluteal drain present and IR following - appendectomy planned for a later date - colonoscopy in 6-8 wks to r/o malignancy - WBC 20.2> 11.8 - Culture reveals abundant E. coli, abundant Bacteroides fragilis, beta-lactamase positive -  1/16- Zosyn  changed to Rocephin  by pharmacy - on solid diet now - will need drain in ELQ due to fluid collection- planned for tomorrow  Active Problems:  Bacteremia with bacteroid fragilis - present in both sets of blood cultures - see above   1/4 bottles growing  staph capitis -likely contaminant     New onset atrial flutter - heparin  infusion held on 1/15   - Metoprolol  50 mg BID - 2D echo reveals an EF of 60 to 65%, indeterminant diastolic filling pressure due to E-A fusion - Patient was asymptomatic - Appreciate cardiology eval - Converted  back to sinus rhythm - Eliquis  started 1/16 , then held - transitioning back to Heparin    Hypokalemia - Replaced  Elevated LFTs - Possibly secondary to perforated appendix - are improving - No old labs to compare with.    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  OA b/l knees - Cymbalta  and PT - follows with neurology and ortho    Code Status: Full Code Total time on patient care: 30 minutes DVT prophylaxis:  Heparin  infusion     Objective:   Vitals:   07/16/24 2147 07/17/24 0559 07/17/24 0836 07/17/24 1244  BP: (!) 124/57 127/63 130/63 111/62  Pulse: 71 65 68 63  Resp: 18 18  16   Temp: 98.8 F (37.1 C) 98.2 F (36.8 C)  98.2 F (36.8 C)  TempSrc:    Oral  SpO2: 99% 99%  96%  Weight:      Height:       Filed Weights   07/11/24 1500 07/11/24 1651 07/12/24 0721  Weight: 79.8 kg 84.6 kg 84.6 kg   Exam: General exam: Appears comfortable  HEENT: oral mucosa moist-  Respiratory system: Clear to auscultation.  Cardiovascular system: S1 & S2 heard-  RRR Gastrointestinal system: Abdomen soft, tender in RLQ, nondistended. Normal bowel sounds-drain noted with milky fluid Extremities: No cyanosis, clubbing or edema Psychiatry:  Mood & affect appropriate.   CBC: Recent Labs  Lab 07/11/24 1521 07/12/24 0642 07/13/24 0029 07/14/24 0419 07/15/24 0404 07/16/24 0356 07/17/24 0408  WBC 20.2*   < > 11.8* 11.1* 10.4 11.0* 11.7*  NEUTROABS 17.7*  --   --   --   --   --   --   HGB 17.0   < > 14.8 14.0 11.8* 12.8* 12.9*  HCT 49.2   < > 42.6 42.1 36.2* 38.7* 38.9*  MCV 94.4   < > 95.5 99.3 99.7 98.7 97.7  PLT 344   < > 306 330 315 295 289   < > =  values in this interval not displayed.   Basic Metabolic Panel: Recent Labs  Lab 07/11/24 1521 07/12/24 0642 07/13/24 0029 07/14/24 0419 07/14/24 1427  NA 137 136 138 142 143  K 3.5 3.9 3.3* 3.9 3.8  CL 98 98 102 107 107  CO2 21* 24 23 22 23   GLUCOSE 152* 120* 100* 93 89  BUN 38* 34* 34* 27* 24*  CREATININE 1.41* 1.29* 1.22 1.12 1.03  CALCIUM 10.4* 9.3 9.2 9.1 8.8*     Scheduled Meds:  DULoxetine   30 mg Oral BID   metoprolol  tartrate  50 mg Oral BID   montelukast   10 mg Oral BH-[id number]   pantoprazole  (PROTONIX ) IV  40 mg Intravenous Q24H   sodium chloride  flush  3 mL Intravenous Q12H   sodium chloride  flush  5 mL Intracatheter Q8H   tamsulosin   0.4 mg Oral Daily    Imaging and lab data personally reviewed   Author: Laci Frenkel  07/17/2024 4:07 PM  To contact [facility]>   Check the care team in [facility] and look for the attending/consulting [facility] provider listed  Log into [email or web address] and use [place]'s universal password   Go to> [facility]  and find provider  If you still have difficulty reaching the provider, please page the [facility] (Director on Call) for the Hospitalists listed on amion     "

## 2024-07-17 NOTE — Progress Notes (Signed)
 07/17/2024  Luke Dalton [id number] 05-12-1951  CARE TEAM: PCP: Chrystal Lamarr RAMAN, MD  Outpatient Care Team: Patient Care Team: Chrystal Lamarr RAMAN, MD as PCP - General (Family Medicine) Patel, Donika K, DO as Consulting Physician (Neurology)  Inpatient Treatment Team: Treatment Team:  Rizwan, Saima, MD Ccs, Md, MD Lbcardiology, Rounding, MD Ruthellen Ruthellen Radiology, MD Bobbette Loupe, MD Burnetta Maryelizabeth LABOR, RRT Vilchis, Tona LABOR, NT Nikki Margery FALCON, RN Jerona Lauraine BROCKS, RN Sonda Manuella POUR, RN Utomwen, Marget, [facility] Blinda Jon DASEN, RN   Problem List:   Principal Problem:   Acute appendicitis with perforation, localized peritonitis, and abscess Active Problems:   Persistent atrial fibrillation [facility])   New onset atrial flutter (HCC)   AKI (acute kidney injury)   Transaminitis   Preoperative cardiovascular examination   PAF (paroxysmal atrial fibrillation) (HCC)   *Percutaneous drainage by interventional radiology 07/12/2024*    IMPRESSION: Successful CT guided placement of a right transgluteal 10 French all purpose drain catheter into the pelvic abscess with aspiration of 100 mL of purulent fluid. Samples were sent to the laboratory as requested by the ordering clinical team.   Ester Sides, MD  [facility]       Assessment Luke Dalton = 6 days)      Slowly recovering     Plan: Assessment/Plan Acute perforated appendicitis with multiple abscesses and reactive SBO -Follow-up CAT scan shows pelvic abscess resolved with drain in place.  Serous output.  Could consider removing it prior to discharge but see what IR thinks.  Persistent right lower quadrant fluid collection with rind consistent with persistent abscess.  Over 5 cm to my view.  I ordered percutaneous drainage.  Would favor a drain to make sure he does not have a chronic fistula.  I ordered for today.   Sounds like they will not be able to do till tomorrow.  Hold anticoagulation per IR recommendations.  - Improving.   - Okay to advance to solid diet diet.   -cont IV abx therapy.  Apparently switch from Zosyn  to ceftriaxone  given sensitivities yesterday.  Would favor at least 5 days if not 10 depending on outcome.  After drain placement and meets goals, reasonable to be discharged later a following day.  Surgical plan of interval colonoscopy and then surgery in 6-8 weeks. If colonoscopy within normal limits, will attempt interval appendectomy.  May require ileocolectomy.  We will see.   Patient seems much better but new onset atrial fibrillation, would like to make sure safe to come off anticoagulation perioperatively and for the patient to have CARDIAC CLEARANCE before placing this patient under anesthesia and proceeding with surgery.     FEN -solid diet as tolerated n.p.o. after midnight for planned drain 1/19. electrolytes okay  VTE -off oral DOAC and back on heparin  gtt ID -piperacillin nadine 1/12-1/16.  Switched to ceftriaxone  1/16-   New onset a fib - now in NSR HTN  I updated the patient's status to the patient  Recommendations were made.  Questions were answered.  He expressed understanding & appreciation.      I reviewed nursing notes, hospitalist notes, last 24 h vitals and pain scores, last 48 h intake and output, last 24 h labs and trends, and last 24 h imaging results.  I have reviewed this patient's available data, including medical history, events of note, test results, etc as part of my evaluation.   A significant portion of that time was spent in counseling. Care  during the described time interval was provided by me.  This care required moderate level of medical decision making.  07/17/2024    Subjective: (Chief complaint)  Tolerating drinking contrast.  Still with some right lower quadrant pain but not worsened.  Feeling better  overall.  Objective:  Vital signs:  Vitals:   07/16/24 1337 07/16/24 2147 07/17/24 0559 07/17/24 0836  BP: (!) 110/53 (!) 124/57 127/63 130/63  Pulse: 65 71 65 68  Resp: 12 18 18    Temp: 98.3 F (36.8 C) 98.8 F (37.1 C) 98.2 F (36.8 C)   TempSrc:      SpO2: 100% 99% 99%   Weight:      Height:        Last BM Date : 07/15/24  Intake/Output   Yesterday:  01/17 0701 - 01/18 0700 In: 740 [P.O.:720; I.V.:10] Out: 30 [Drains:30] This shift:  No intake/output data recorded.  Bowel function:  Flatus: YES  BM:  YES  Drain: Transgluteal pelvic drain serous    Physical Exam:  General: Pt awake/alert in no acute distress Eyes: PERRL, normal EOM.  Sclera clear.  No icterus Neuro: CN II-XII intact w/o focal sensory/motor deficits. Lymph: No head/neck/groin lymphadenopathy Psych:  No delerium/psychosis/paranoia.  Oriented x 4 HENT: Normocephalic, Mucus membranes moist.  No thrush Neck: Supple, No tracheal deviation.  No obvious thyromegaly Chest: No pain to chest wall compression.  Good respiratory excursion.  No audible wheezing CV:  Pulses intact.  Regular rhythm.  No major extremity edema MS: Normal AROM mjr joints.  No obvious deformity  Abdomen: Soft.  Mildy distended.  Tenderness at right lower quadrant mild..  No evidence of peritonitis.  No incarcerated hernias.  Ext:   No deformity.  No mjr edema.  No cyanosis Skin: No petechiae / purpurea.  No major sores.  Warm and dry    Results:   Cultures: Recent Results (from the past 720 hours)  Blood culture (routine x 2)     Status: Abnormal   Collection Time: 07/11/24  6:35 PM   Specimen: BLOOD RIGHT FOREARM  Result Value Ref Range Status   Specimen Description   Final    BLOOD RIGHT FOREARM Performed at [facility], [address], [place], [place] [address]    Special Requests   Final    BOTTLES DRAWN AEROBIC AND ANAEROBIC Blood Culture results may not be optimal due to an inadequate volume of blood  received in culture bottles Performed at [facility], [address] [place], [place] [address]    Culture  Setup Time   Final    GRAM POSITIVE COCCI IN CLUSTERS ANAEROBIC BOTTLE ONLY CRITICAL RESULT CALLED TO, READ BACK BY AND VERIFIED WITH: PHARMD M LILLISTON 07/13/2024 @ 0415 BY AB    Culture (A)  Final    STAPHYLOCOCCUS CAPITIS THE SIGNIFICANCE OF ISOLATING THIS ORGANISM FROM A SINGLE SET OF BLOOD CULTURES WHEN MULTIPLE SETS ARE DRAWN IS UNCERTAIN. PLEASE NOTIFY THE MICROBIOLOGY DEPARTMENT WITHIN ONE WEEK IF SPECIATION AND SENSITIVITIES ARE REQUIRED. Performed at [facility], [address] [place], [place] [address]    Report Status 07/15/2024 FINAL  Final  Blood Culture ID Panel (Reflexed)     Status: Abnormal   Collection Time: 07/11/24  6:35 PM  Result Value Ref Range Status   Enterococcus faecalis NOT DETECTED NOT DETECTED Final   Enterococcus Faecium NOT DETECTED NOT DETECTED Final   Listeria monocytogenes NOT DETECTED NOT DETECTED Final   Staphylococcus species  DETECTED (A) NOT DETECTED Final    Comment: CRITICAL RESULT CALLED TO, READ BACK BY AND VERIFIED WITH: PHARMD M LILLISTON 07/13/2024 @ 0415 BY AB    Staphylococcus aureus (BCID) NOT DETECTED NOT DETECTED Final   Staphylococcus epidermidis NOT DETECTED NOT DETECTED Final   Staphylococcus lugdunensis NOT DETECTED NOT DETECTED Final   Streptococcus species NOT DETECTED NOT DETECTED Final   Streptococcus agalactiae NOT DETECTED NOT DETECTED Final   Streptococcus pneumoniae NOT DETECTED NOT DETECTED Final   Streptococcus pyogenes NOT DETECTED NOT DETECTED Final   A.calcoaceticus-baumannii NOT DETECTED NOT DETECTED Final   Bacteroides fragilis NOT DETECTED NOT DETECTED Final   Enterobacterales NOT DETECTED NOT DETECTED Final   Enterobacter cloacae complex NOT DETECTED NOT DETECTED Final   Escherichia coli NOT DETECTED NOT DETECTED Final   Klebsiella aerogenes NOT DETECTED NOT DETECTED Final    Klebsiella oxytoca NOT DETECTED NOT DETECTED Final   Klebsiella pneumoniae NOT DETECTED NOT DETECTED Final   Proteus species NOT DETECTED NOT DETECTED Final   Salmonella species NOT DETECTED NOT DETECTED Final   Serratia marcescens NOT DETECTED NOT DETECTED Final   Haemophilus influenzae NOT DETECTED NOT DETECTED Final   Neisseria meningitidis NOT DETECTED NOT DETECTED Final   Pseudomonas aeruginosa NOT DETECTED NOT DETECTED Final   Stenotrophomonas maltophilia NOT DETECTED NOT DETECTED Final   Candida albicans NOT DETECTED NOT DETECTED Final   Candida auris NOT DETECTED NOT DETECTED Final   Candida glabrata NOT DETECTED NOT DETECTED Final   Candida krusei NOT DETECTED NOT DETECTED Final   Candida parapsilosis NOT DETECTED NOT DETECTED Final   Candida tropicalis NOT DETECTED NOT DETECTED Final   Cryptococcus neoformans/gattii NOT DETECTED NOT DETECTED Final    Comment: Performed at [facility], [address], [place], [place] [address]  Blood culture (routine x 2)     Status: Abnormal   Collection Time: 07/11/24  6:40 PM   Specimen: BLOOD RIGHT WRIST  Result Value Ref Range Status   Specimen Description   Final    BLOOD RIGHT WRIST Performed at [facility], [address] [place], [place] [address]    Special Requests   Final    BOTTLES DRAWN AEROBIC AND ANAEROBIC Blood Culture results may not be optimal due to an inadequate volume of blood received in culture bottles Performed at [facility], [address] [place], [place] [address]    Culture  Setup Time   Final    GRAM NEGATIVE RODS ANAEROBIC BOTTLE ONLY CRITICAL RESULT CALLED TO, READ BACK BY AND VERIFIED WITH:  MITCHELL LILLISTON PHARMD 07/14/2024 BY DD @ 0210    Culture (A)  Final    BACTEROIDES FRAGILIS BETA LACTAMASE POSITIVE Performed at [facility], [address] [place], [place] [address]    Report Status 07/16/2024 FINAL  Final  Blood Culture ID Panel (Reflexed)      Status: Abnormal   Collection Time: 07/11/24  6:40 PM  Result Value Ref Range Status   Enterococcus faecalis NOT DETECTED NOT DETECTED Final   Enterococcus Faecium NOT DETECTED NOT DETECTED Final   Listeria monocytogenes NOT DETECTED NOT DETECTED Final   Staphylococcus species NOT DETECTED NOT DETECTED Final   Staphylococcus aureus (BCID) NOT DETECTED NOT DETECTED Final   Staphylococcus epidermidis NOT DETECTED NOT DETECTED Final   Staphylococcus lugdunensis NOT DETECTED NOT DETECTED Final   Streptococcus species NOT DETECTED NOT DETECTED Final   Streptococcus agalactiae NOT DETECTED NOT DETECTED Final   Streptococcus pneumoniae  NOT DETECTED NOT DETECTED Final   Streptococcus pyogenes NOT DETECTED NOT DETECTED Final   A.calcoaceticus-baumannii NOT DETECTED NOT DETECTED Final   Bacteroides fragilis DETECTED (A) NOT DETECTED Final    Comment: CRITICAL RESULT CALLED TO, READ BACK BY AND VERIFIED WITH:  MITCHELL LILLISTON PHARMD 07/14/2024 BY DD @ 0210    Enterobacterales NOT DETECTED NOT DETECTED Final   Enterobacter cloacae complex NOT DETECTED NOT DETECTED Final   Escherichia coli NOT DETECTED NOT DETECTED Final   Klebsiella aerogenes NOT DETECTED NOT DETECTED Final   Klebsiella oxytoca NOT DETECTED NOT DETECTED Final   Klebsiella pneumoniae NOT DETECTED NOT DETECTED Final   Proteus species NOT DETECTED NOT DETECTED Final   Salmonella species NOT DETECTED NOT DETECTED Final   Serratia marcescens NOT DETECTED NOT DETECTED Final   Haemophilus influenzae NOT DETECTED NOT DETECTED Final   Neisseria meningitidis NOT DETECTED NOT DETECTED Final   Pseudomonas aeruginosa NOT DETECTED NOT DETECTED Final   Stenotrophomonas maltophilia NOT DETECTED NOT DETECTED Final   Candida albicans NOT DETECTED NOT DETECTED Final   Candida auris NOT DETECTED NOT DETECTED Final   Candida glabrata NOT DETECTED NOT DETECTED Final   Candida krusei NOT DETECTED NOT DETECTED Final   Candida parapsilosis NOT  DETECTED NOT DETECTED Final   Candida tropicalis NOT DETECTED NOT DETECTED Final   Cryptococcus neoformans/gattii NOT DETECTED NOT DETECTED Final    Comment: Performed at [facility], [address], [place], [place] [address]  Aerobic/Anaerobic Culture w Gram Stain (surgical/deep wound)     Status: None   Collection Time: 07/12/24  3:03 PM   Specimen: Abscess  Result Value Ref Range Status   Specimen Description   Final    ABSCESS RIGHT PELVIS Performed at [facility], [address] [place], [place] [address]    Special Requests   Final    NONE Performed at [facility], [address] [place], [place] [address]    Gram Stain   Final    ABUNDANT WBC PRESENT, PREDOMINANTLY PMN ABUNDANT GRAM VARIABLE ROD    Culture   Final    ABUNDANT ESCHERICHIA COLI ABUNDANT BACTEROIDES FRAGILIS BETA LACTAMASE POSITIVE Performed at [facility], [address], [place], [place] [address]    Report Status 07/15/2024 FINAL  Final   Organism ID, Bacteria ESCHERICHIA COLI  Final      Susceptibility   Escherichia coli - MIC*    AMPICILLIN >=32 RESISTANT Resistant     CEFAZOLIN  (NON-URINE) 4 INTERMEDIATE Intermediate     CEFEPIME <=0.12 SENSITIVE Sensitive     ERTAPENEM <=0.12 SENSITIVE Sensitive     CEFTRIAXONE  <=0.25 SENSITIVE Sensitive     CIPROFLOXACIN  <=0.06 SENSITIVE Sensitive     GENTAMICIN <=1 SENSITIVE Sensitive     MEROPENEM <=0.25 SENSITIVE Sensitive     TRIMETH/SULFA <=20 SENSITIVE Sensitive     AMPICILLIN/SULBACTAM 16 INTERMEDIATE Intermediate     PIP/TAZO Value in next row Sensitive      <=4 SENSITIVEThis is a modified FDA-approved test that has been validated and its performance characteristics determined by the reporting laboratory.  This laboratory is certified under the Clinical Laboratory Improvement Amendments CLIA as qualified to perform high complexity clinical laboratory testing.    * ABUNDANT ESCHERICHIA COLI     Labs: Results for orders placed or performed during the hospital encounter of 07/11/24 (from the past 48 hours)  CBC     Status: Abnormal   Collection Time: 07/16/24  3:56 AM  Result Value Ref Range   WBC 11.0 (H) 4.0 - 10.5 K/uL   RBC 3.92 (L) 4.22 - 5.81 MIL/uL   Hemoglobin 12.8 (L) 13.0 - 17.0 g/dL   HCT 61.2 (L) 60.9 - 47.9 %   MCV 98.7 80.0 - 100.0 fL   MCH 32.7 26.0 - 34.0 pg   MCHC 33.1 30.0 - 36.0 g/dL   RDW 87.2 88.4 - 84.4 %   Platelets 295 150 - 400 K/uL   nRBC 0.0 0.0 - 0.2 %    Comment: Performed at [facility], [address], [place], [place] [address]  CBC     Status: Abnormal   Collection Time: 07/17/24  4:08 AM  Result Value Ref Range   WBC 11.7 (H) 4.0 - 10.5 K/uL   RBC 3.98 (L) 4.22 - 5.81 MIL/uL   Hemoglobin 12.9 (L) 13.0 - 17.0 g/dL   HCT 61.0 (L) 60.9 - 47.9 %   MCV 97.7 80.0 - 100.0 fL   MCH 32.4 26.0 - 34.0 pg   MCHC 33.2 30.0 - 36.0 g/dL   RDW 87.4 88.4 - 84.4 %   Platelets 289 150 - 400 K/uL   nRBC 0.0 0.0 - 0.2 %    Comment: Performed at [facility], [address], [place], [place] [address]    Imaging / Studies: CT ABDOMEN PELVIS W CONTRAST Result Date: 07/16/2024 EXAM: CT ABDOMEN AND PELVIS WITH CONTRAST 07/16/2024 11:05:33 AM TECHNIQUE: CT of the abdomen and pelvis was performed with the administration of 100 mL of iohexol  (OMNIPAQUE ) 300 MG/ML solution. Multiplanar reformatted images are provided for review. Automated exposure control, iterative reconstruction, and/or weight-based adjustment of the mA/kV was utilized to reduce the radiation dose to as low as reasonably achievable. COMPARISON: CT 07/11/2024. CLINICAL HISTORY: Intra-abdominal abscess/fluid retention evaluation; Right lower abdominal pain, perforated appendicitis with intra-abdominal abscesses; status post right pelvic abscess drain via transgluteal approach on 07/12/2024. FINDINGS: LOWER CHEST: No acute abnormality. LIVER: The liver is  unremarkable. GALLBLADDER AND BILE DUCTS: Gallbladder is unremarkable. No biliary ductal dilatation. SPLEEN: No acute abnormality. PANCREAS: No acute abnormality. ADRENAL GLANDS: No acute abnormality. KIDNEYS, URETERS AND BLADDER: No stones in the kidneys or ureters. No hydronephrosis. No perinephric or periureteral stranding. Urinary bladder is unremarkable. GI AND BOWEL: Stomach demonstrates no acute abnormality. The appendix is not well identified. There is probable small abscess at the tip of the cecum measuring 4.7 x 2.9 cm. Oral contrast transients the entirety of the small bowel into the ascending colon. No evidence of bowel obstruction or perforation. Some secondary inflammation of the sigmoid colon related to the perforation. PERITONEUM AND RETROPERITONEUM: Interval placement of a transgluteal mucous catheter. There is near complete resolution of the fluid collection previously seen in the posterior cul de sac anterior to the rectum. Several smaller collections remain in the right lower quadrant. Mild enhancement collection along the right iliac vessels measures 3.2 x 2.4 cm, slight increase from 3.0 x 1.8 cm. There is interval decrease in inflammation adjacent to the supraspinatus tip. No free air. VASCULATURE: Aorta is normal in caliber. LYMPH NODES: No lymphadenopathy. REPRODUCTIVE ORGANS: No acute abnormality. BONES AND SOFT TISSUES: No acute osseous abnormality. No focal soft tissue abnormality. IMPRESSION: 1. Improvement in inflammatory / infectious process in the right lobe quadrant (perforated appendicitis) 2. Near complete resolution of the fluid collection previously seen in the posterior cul de sac anterior to the rectum following interval transgluteal drain placement. 3. Several smaller right lower quadrant collections  persist, including a collection along the right iliac vessels measuring 3.2 x 2.4 cm (slightly increased from 3.0 x 1.8 cm) and a probable small abscess at the tip of the cecum  measuring 4.7 x 2.9 cm. 4. No evidence of bowel obstruction or perforation. Electronically signed by: Norleen Boxer MD 07/16/2024 03:54 PM EST RP Workstation: [id number]    Medications / Allergies: per chart  Antibiotics: Anti-infectives (From admission, onward)    Start     Dose/Rate Route Frequency Ordered Stop   07/15/24 1600  cefTRIAXone  (ROCEPHIN ) 2 g in sodium chloride  0.9 % 100 mL IVPB        2 g 200 mL/hr over 30 Minutes Intravenous Every 24 hours 07/15/24 1507     07/15/24 1600  metroNIDAZOLE  (FLAGYL ) tablet 500 mg  Status:  Discontinued        500 mg Oral Every 8 hours 07/15/24 1507 07/16/24 0856   07/12/24 0300  piperacillin -tazobactam (ZOSYN ) IVPB 3.375 g  Status:  Discontinued        3.375 g 12.5 mL/hr over 240 Minutes Intravenous Every 8 hours 07/11/24 1842 07/11/24 1843   07/12/24 0100  piperacillin -tazobactam (ZOSYN ) IVPB 3.375 g  Status:  Discontinued        3.375 g 12.5 mL/hr over 240 Minutes Intravenous Every 8 hours 07/11/24 1843 07/15/24 1507   07/11/24 1815  piperacillin -tazobactam (ZOSYN ) IVPB 3.375 g        3.375 g 100 mL/hr over 30 Minutes Intravenous  Once 07/11/24 1814 07/11/24 1946         Note: Portions of this report may have been transcribed using voice recognition software. Every effort was made to ensure accuracy; however, inadvertent computerized transcription errors may be present.   Any transcriptional errors that result from this process are unintentional.    Elspeth KYM Schultze, MD, FACS, MASCRS Esophageal, Gastrointestinal & Colorectal Surgery Robotic and Minimally Invasive Surgery  [facility] [address], [address] [place], [place] [address] [phone] Fax [phone] Main  CONTACT INFORMATION: Weekday (9AM-5PM): Call [facility] main office at [phone] Weeknight (5PM-9AM) or Weekend/Holiday: Check EPIC Web Links tab & use AMION (password  [email or web address]) for General Surgery [facility]  coverage  Please, DO NOT use SecureChat  (it is not reliable communication to reach operating surgeons & will lead to a delay in care).   Epic staff messaging available for outpatient concerns needing 1-2 business day response.      07/17/2024  9:25 AM

## 2024-07-17 NOTE — Progress Notes (Signed)
 PHARMACY - ANTICOAGULATION CONSULT NOTE  Pharmacy Consult for heparin   Indication: atrial fibrillation  Allergies[1]  Patient Measurements: Height: 5' 10 (177.8 cm) Weight: 84.6 kg (186 lb 8.2 oz) IBW/kg (Calculated) : 73 HEPARIN  DW (KG): 84.6  Vital Signs: Temp: 98.4 F (36.9 C) (01/18 2111) Temp Source: Oral (01/18 2111) BP: 146/63 (01/18 2111) Pulse Rate: 91 (01/18 2111)  Labs: Recent Labs    07/15/24 0404 07/16/24 0356 07/17/24 0408 07/17/24 2048  HGB 11.8* 12.8* 12.9*  --   HCT 36.2* 38.7* 38.9*  --   PLT 315 295 289  --   APTT  --   --   --  96*    Estimated Creatinine Clearance: 65 mL/min (by C-G formula based on SCr of 1.03 mg/dL).  Assessment: Patient is a [name] who presented to the ED on 07/11/24 with c/o abdominal pain.  He was found to be in afib and abdominal CT showed perforated appendicitis with multiple pelvic abscesses. [facility] recom.  perc drains for abscess.  Pharmacy has been consulted to dose heparin  for afib.  Significant Events: 1/13: Heparin  held at 5a for IR drain placement and resume post-procedure ~4p 1/14-15: Heparin  held due to coffee - ground blood in NGT, has since been removed per RN  1/16: Transitioned to apixaban  (LD 1/17 @0830 ) 1/18: Pharmacy consulted to restart heparin  in anticipation for drain placement in residual RLQ fluid collection   Today, 07/17/2024: CBC stable: Hgb 11-12s. PLTc WNL 20:48 aPTT = 96sec (therapeutic) with heparin  gtt @ 1700 units/hr No signs or symptoms of bleeding noted  Goal of Therapy:  Heparin  level 0.3-0.7 units/ml aPTT goal 66-102 sec Monitor platelets by anticoagulation protocol: Yes   Plan:  Continue heparin  infusion at 1700 units/hr Per IR, will hold heparin  @ 6AM on 1/19 for procedure.   Follow up anticoagulation plans post procedure   Thank you for allowing pharmacy to be a part of this patients care.  Arvin Gauss, PharmD Clinical Pharmacist 07/17/2024 9:49 PM           [1]  Allergies Allergen Reactions   Losartan     Other Reaction(s): tachycardia, hypotension at 50 mg dose

## 2024-07-17 NOTE — Progress Notes (Addendum)
 PHARMACY - ANTICOAGULATION CONSULT NOTE  Pharmacy Consult for heparin   Indication: atrial fibrillation  Allergies[1]  Patient Measurements: Height: 5' 10 (177.8 cm) Weight: 84.6 kg (186 lb 8.2 oz) IBW/kg (Calculated) : 73 HEPARIN  DW (KG): 84.6  Vital Signs: Temp: 98.2 F (36.8 C) (01/18 0559) BP: 130/63 (01/18 0836) Pulse Rate: 68 (01/18 0836)  Labs: Recent Labs    07/14/24 1427 07/15/24 0404 07/15/24 0404 07/16/24 0356 07/17/24 0408  HGB  --  11.8*   < > 12.8* 12.9*  HCT  --  36.2*  --  38.7* 38.9*  PLT  --  315  --  295 289  CREATININE 1.03  --   --   --   --    < > = values in this interval not displayed.    Estimated Creatinine Clearance: 65 mL/min (by C-G formula based on SCr of 1.03 mg/dL).  Assessment: Patient is a [name] who presented to the ED on 07/11/24 with c/o abdominal pain.  He was found to be in afib and abdominal CT showed perforated appendicitis with multiple pelvic abscesses. CCS recom.  perc drains for abscess.  Pharmacy has been consulted to dose heparin  for afib.  Significant Events: 1/13: Heparin  held at 5a for IR drain placement and resume post-procedure ~4p 1/14-15: Heparin  held due to coffee - ground blood in NGT, has since been removed per RN  1/16: Transitioned to apixaban  (LD 1/17 @0830 ) 1/18: Pharmacy consulted to restart heparin  in anticipation for drain placement in residual RLQ fluid collection   Today, 07/17/2024: CBC stable: Hgb 11-12s. PLTc WNL No signs or symptoms of bleeding per nursing.    Goal of Therapy:  Heparin  level 0.3-0.7 units/ml Monitor platelets by anticoagulation protocol: Yes   Plan:  Start heparin  infusion at previously therapeutic rate 1700 units/hr Check aPTT in 8 hours after infusion start  Monitor daily CBC and aPTT  Closely monitor for s/sx bleeding or thrombosis Per IR, will need to hold heparin  4 hours prior to procedure tomorrow. F/u time of surgery.    Addendum 1958:  Per Dr. Jennefer (IR), will  hold heparin  infusion @ 6AM 1/19 (stop time placed) prior to procedure Follow up anticoagulation plans post procedure    Thank you for allowing pharmacy to be a part of this patients care.  Marget Hench, PharmD Clinical Pharmacist 07/17/2024 12:21 PM         [1]  Allergies Allergen Reactions   Losartan     Other Reaction(s): tachycardia, hypotension at 50 mg dose

## 2024-07-18 ENCOUNTER — Inpatient Hospital Stay (HOSPITAL_COMMUNITY)

## 2024-07-18 ENCOUNTER — Inpatient Hospital Stay

## 2024-07-18 ENCOUNTER — Other Ambulatory Visit: Payer: Self-pay | Admitting: Student

## 2024-07-18 DIAGNOSIS — I4819 Other persistent atrial fibrillation: Secondary | ICD-10-CM

## 2024-07-18 DIAGNOSIS — K3532 Acute appendicitis with perforation and localized peritonitis, without abscess: Secondary | ICD-10-CM | POA: Diagnosis not present

## 2024-07-18 LAB — CBC
HCT: 38.1 % — ABNORMAL LOW (ref 39.0–52.0)
Hemoglobin: 12.7 g/dL — ABNORMAL LOW (ref 13.0–17.0)
MCH: 32.5 pg (ref 26.0–34.0)
MCHC: 33.3 g/dL (ref 30.0–36.0)
MCV: 97.4 fL (ref 80.0–100.0)
Platelets: 302 K/uL (ref 150–400)
RBC: 3.91 MIL/uL — ABNORMAL LOW (ref 4.22–5.81)
RDW: 12.3 % (ref 11.5–15.5)
WBC: 11.5 K/uL — ABNORMAL HIGH (ref 4.0–10.5)
nRBC: 0 % (ref 0.0–0.2)

## 2024-07-18 LAB — HEPARIN LEVEL (UNFRACTIONATED): Heparin Unfractionated: 0.54 [IU]/mL (ref 0.30–0.70)

## 2024-07-18 MED ORDER — FENTANYL CITRATE (PF) 100 MCG/2ML IJ SOLN
INTRAMUSCULAR | Status: AC
  Filled 2024-07-18: qty 2

## 2024-07-18 MED ORDER — MIDAZOLAM HCL 2 MG/2ML IJ SOLN
INTRAMUSCULAR | Status: AC
  Filled 2024-07-18: qty 2

## 2024-07-18 MED ORDER — MIDAZOLAM HCL (PF) 2 MG/2ML IJ SOLN
INTRAMUSCULAR | Status: AC | PRN
  Administered 2024-07-18: 1 mg via INTRAVENOUS

## 2024-07-18 MED ORDER — IOHEXOL 300 MG/ML  SOLN
75.0000 mL | Freq: Once | INTRAMUSCULAR | Status: AC | PRN
  Administered 2024-07-18: 75 mL via INTRAVENOUS

## 2024-07-18 MED ORDER — HEPARIN (PORCINE) 25000 UT/250ML-% IV SOLN
1700.0000 [IU]/h | INTRAVENOUS | Status: DC
  Administered 2024-07-18: 1700 [IU]/h via INTRAVENOUS
  Administered 2024-07-19: 1550 [IU]/h via INTRAVENOUS
  Filled 2024-07-18 (×2): qty 250

## 2024-07-18 MED ORDER — SODIUM CHLORIDE 0.9% FLUSH
5.0000 mL | Freq: Three times a day (TID) | INTRAVENOUS | Status: DC
  Administered 2024-07-18 – 2024-07-19 (×4): 5 mL

## 2024-07-18 MED ORDER — FENTANYL CITRATE (PF) 100 MCG/2ML IJ SOLN
INTRAMUSCULAR | Status: AC | PRN
  Administered 2024-07-18: 25 ug via INTRAVENOUS

## 2024-07-18 MED ORDER — METRONIDAZOLE 500 MG PO TABS
500.0000 mg | ORAL_TABLET | Freq: Two times a day (BID) | ORAL | Status: DC
  Administered 2024-07-18 – 2024-07-19 (×3): 500 mg via ORAL
  Filled 2024-07-18 (×3): qty 1

## 2024-07-18 NOTE — Progress Notes (Signed)
" ° °  Reviewed cardiology note from 1/17. Has converted to NSR from afib. Per Dr. Lavona, plan was to arrange outpatient monitor - we will arrange this as an outpatient. LVEF is normal on echo. From a cardiology standpoint, would be at ACCEPTABLE RISK for surgery in the future for appendectomy in 6-8 weeks.   No further suggestions at this time. Cardiology will sign-off.  [facility] will sign off.   Medication Recommendations:  as above Other recommendations (labs, testing, etc):  none Follow up as an outpatient:  Dr. Delford or APP in 6-8 weeks after monitoring.  Vinie KYM Maxcy, MD, [facility], FNLA, FACP  [place]  [facility]  Medical Director of the [facility]  [facility] Clinic Diplomate of the American Board of Clinical Lipidology Attending Cardiologist  Direct Dial: [phone]  Fax: [phone]  Website:  [email or web address]   "

## 2024-07-18 NOTE — Progress Notes (Signed)
 PHARMACY - ANTICOAGULATION CONSULT NOTE  Pharmacy Consult for heparin   Indication: atrial fibrillation  Allergies[1]  Patient Measurements: Height: 5' 10 (177.8 cm) Weight: 84.6 kg (186 lb 8.2 oz) IBW/kg (Calculated) : 73 HEPARIN  DW (KG): 84.6  Vital Signs: Temp: 98 F (36.7 C) (01/19 1223) Temp Source: Oral (01/19 1223) BP: 131/66 (01/19 1223) Pulse Rate: 73 (01/19 1223)  Labs: Recent Labs    07/16/24 0356 07/17/24 0408 07/17/24 2048 07/18/24 0351  HGB 12.8* 12.9*  --  12.7*  HCT 38.7* 38.9*  --  38.1*  PLT 295 289  --  302  APTT  --   --  96*  --     Estimated Creatinine Clearance: 65 mL/min (by C-G formula based on SCr of 1.03 mg/dL).  Assessment: Patient is a [name] who presented to the ED on 07/11/24 with c/o abdominal pain.  He was found to be in afib and abdominal CT showed perforated appendicitis with multiple pelvic abscesses. CCS recom.  perc drains for abscess.  Pharmacy has been consulted to dose heparin  for afib.  Significant Events: 1/13: Heparin  held at 5a for IR drain placement and resume post-procedure ~4p 1/14-15: Heparin  held due to coffee - ground blood in NGT, has since been removed per RN  1/16: Transitioned to apixaban  (LD 1/17 @0830 ) 1/18: Pharmacy consulted to restart heparin  in anticipation for drain placement in residual RLQ fluid collection   Today, 07/18/2024: CBC stable: Hgb 11-12s. PLTc WNL IV Heparin  held this am and two RLQ abscess drains placed by IR.  Goal of Therapy:  Heparin  level 0.3-0.7 units/ml (HL) aPTT goal 66-102 sec Monitor platelets by anticoagulation protocol: Yes   Plan:  OK per IR to restart IV heparin  2 hours after procedure  Restart IV heparin  at 1700 units/hr 8 hr aPTT & HL Monitor daily aPTT & HL until correlating then monitor using only HL, CBC, signs/symptoms of bleeding Follow up anticoagulation plans post procedures   Thank you for allowing pharmacy to be a part of this patients care.  Eleanor EMERSON Agent, PharmD, BCPS Clinical Pharmacist [place] 07/18/2024 1:10 PM            [1]  Allergies Allergen Reactions   Losartan     Other Reaction(s): tachycardia, hypotension at 50 mg dose

## 2024-07-18 NOTE — Progress Notes (Signed)
 Orders for 14 day cardiac monitor for atrial fibrillation. Dr Delford to read

## 2024-07-18 NOTE — Progress Notes (Unsigned)
Enrolled for [employer] to mail a ZIO XT long term holter monitor to the patients address on file.   Dr. Nishan to read. 

## 2024-07-18 NOTE — Progress Notes (Signed)
 " [facility] Progress Note  Patient: Luke Dalton     FMW:[id number]  DOA: 07/11/2024   PCP: Chrystal Lamarr RAMAN, MD       Brief hospital course: 74 y.o. male with PMH of f hypertension, dyslipidemia, BPH and spinal stenosis s/p of steroid injection a week ago came to ED abdominal pain RLQ sicne Wednesday 07/06/24 with associated nausea, some diarrhea.  The patient states that the pain was on and off on Wednesday and seem to improve with heat and ice.  However, on the following day the pain  became severe and constant.  He was following up with his neurosurgeon on Monday who evaluated him and recommended that he go to the emergency department to have his appendix assessed.  In ED A-fib/flutter (new) WBC 20.2 Cr 1.41 Bicarb 21 with AG of 18 Mildly elevated LFTs U sp gravity> 1.046  CT a/p Perforated appendix with abd/ pelvic abscesses, some SBO 1/13- transgluteal drain by IR- 150 cc of purulent fluid  Subjective:  No pain in RLQ today. No complaints.   Assessment and Plan: Principal Problem:   Perforated appendix resulting in multiple intra-abdominal abscesses, reactive SBO - general surgery managing - R gluteal drain present and IR following - appendectomy planned for a later date - colonoscopy in 6-8 wks to r/o malignancy - WBC 20.2> 11.5 - Culture reveals abundant E. coli, abundant Bacteroides fragilis, beta-lactamase positive -  1/16- Zosyn  changed to Rocephin  + Flagyl  by pharmacy - RLQ drains x 2 placed- 10 cc of fluid removed- purulent in 2nd drain  Active Problems:  Bacteremia with bacteroid fragilis - present in both sets of blood cultures - see above   1/4 bottles growing  staph capitis -likely contaminant     New onset atrial flutter - heparin  infusion held on 1/15   - Metoprolol  50 mg BID - 2D echo reveals an EF of 60 to 65%, indeterminant diastolic filling pressure due to E-A fusion - Patient was asymptomatic - Appreciate cardiology eval -  Converted back to sinus rhythm - Eliquis  started 1/16 , then held - transitioning back to Heparin  for drain placement - is back in A-fib- rate controlled  - will need outpt monitor- cardiology signed off  Hypokalemia - Replaced  Elevated LFTs - Possibly secondary to perforated appendix - are improving - No old labs to compare with.    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  OA b/l knees - treated with Cymbalta  and PT - follows with neurology and ortho - has been ambulating in hall w/o issues     Code Status: Full Code Total time on patient care: 30 minutes DVT prophylaxis:  Heparin  infusion to be resumed     Objective:   Vitals:   07/18/24 1135 07/18/24 1140 07/18/24 1200 07/18/24 1223  BP: 131/64 126/62  131/66  Pulse: 69 62  73  Resp: 20 19 20 16   Temp:    98 F (36.7 C)  TempSrc:    Oral  SpO2: 100% 100%  100%  Weight:      Height:       Filed Weights   07/11/24 1500 07/11/24 1651 07/12/24 0721  Weight: 79.8 kg 84.6  kg 84.6 kg   Exam: General exam: Appears comfortable  HEENT: oral mucosa moist-  Respiratory system: Clear to auscultation.  Cardiovascular system: S1 & S2 heard- RRR Gastrointestinal system: Abdomen soft,non tender in RLQ, nondistended. Normal bowel sounds-drain noted with milky fluid Extremities: No cyanosis, clubbing or edema Psychiatry:  Mood & affect appropriate.   CBC: Recent Labs  Lab 07/11/24 1521 07/12/24 0642 07/14/24 0419 07/15/24 0404 07/16/24 0356 07/17/24 0408 07/18/24 0351  WBC 20.2*   < > 11.1* 10.4 11.0* 11.7* 11.5*  NEUTROABS 17.7*  --   --   --   --   --   --   HGB 17.0   < > 14.0 11.8*  12.8* 12.9* 12.7*  HCT 49.2   < > 42.1 36.2* 38.7* 38.9* 38.1*  MCV 94.4   < > 99.3 99.7 98.7 97.7 97.4  PLT 344   < > 330 315 295 289 302   < > = values in this interval not displayed.   Basic Metabolic Panel: Recent Labs  Lab 07/11/24 1521 07/12/24 0642 07/13/24 0029 07/14/24 0419 07/14/24 1427  NA 137 136 138 142 143  K 3.5 3.9 3.3* 3.9 3.8  CL 98 98 102 107 107  CO2 21* 24 23 22 23   GLUCOSE 152* 120* 100* 93 89  BUN 38* 34* 34* 27* 24*  CREATININE 1.41* 1.29* 1.22 1.12 1.03  CALCIUM 10.4* 9.3 9.2 9.1 8.8*     Scheduled Meds:  DULoxetine   30 mg Oral BID   metoprolol  tartrate  50 mg Oral BID   metroNIDAZOLE   500 mg Oral Q12H   montelukast   10 mg Oral BH-q7a   pantoprazole  (PROTONIX ) IV  40 mg Intravenous Q24H   sodium chloride  flush  3 mL Intravenous Q12H   sodium chloride  flush  5 mL Intracatheter Q8H   sodium chloride  flush  5 mL Intracatheter Q8H   tamsulosin   0.4 mg Oral Daily    Imaging and lab data personally reviewed   Author: Lerline Valdivia  07/18/2024 12:42 PM  To contact [facility]>   Check the care team in [facility] and look for the attending/consulting [facility] provider listed  Log into [email or web address] and use [facility]'s universal password   Go to> [facility]  and find provider  If you still have difficulty reaching the provider, please page the [facility] (Director on Call) for the Hospitalists listed on amion     "

## 2024-07-18 NOTE — Progress Notes (Signed)
 "      Subjective: Diarrhea a few times this morning.   Objective: Vital signs in last 24 hours: Temp:  [98.1 F (36.7 C)-98.4 F (36.9 C)] 98.1 F (36.7 C) (01/19 0522) Pulse Rate:  [63-91] 76 (01/19 0522) Resp:  [16-18] 18 (01/19 0522) BP: (111-146)/(62-72) 120/72 (01/19 0522) SpO2:  [96 %-97 %] 97 % (01/19 0522) Last BM Date : 07/18/24  Intake/Output from previous day: 01/18 0701 - 01/19 0700 In: 495.8 [P.O.:480; I.V.:15.8] Out: 880 [Urine:850; Drains:30] Intake/Output this shift: No intake/output data recorded.  PE: Gen: NAD Abd: soft, nontender, IR drain with serous fluid, 30 cc yesterday  Lab Results:  Recent Labs    07/17/24 0408 07/18/24 0351  WBC 11.7* 11.5*  HGB 12.9* 12.7*  HCT 38.9* 38.1*  PLT 289 302   BMET No results for input(s): NA, K, CL, CO2, GLUCOSE, BUN, CREATININE, CALCIUM in the last 72 hours.  PT/INR No results for input(s): LABPROT, INR in the last 72 hours.  CMP     Component Value Date/Time   NA 143 07/14/2024 1427   K 3.8 07/14/2024 1427   CL 107 07/14/2024 1427   CO2 23 07/14/2024 1427   GLUCOSE 89 07/14/2024 1427   BUN 24 (H) 07/14/2024 1427   CREATININE 1.03 07/14/2024 1427   CALCIUM 8.8 (L) 07/14/2024 1427   PROT 6.1 (L) 07/14/2024 1427   ALBUMIN 3.1 (L) 07/14/2024 1427   AST 31 07/14/2024 1427   ALT 55 (H) 07/14/2024 1427   ALKPHOS 79 07/14/2024 1427   BILITOT 1.1 07/14/2024 1427   GFRNONAA >60 07/14/2024 1427   Lipase     Component Value Date/Time   LIPASE 17 07/11/2024 1521       Studies/Results: CT ABDOMEN PELVIS W CONTRAST Result Date: 07/16/2024 EXAM: CT ABDOMEN AND PELVIS WITH CONTRAST 07/16/2024 11:05:33 AM TECHNIQUE: CT of the abdomen and pelvis was performed with the administration of 100 mL of iohexol  (OMNIPAQUE ) 300 MG/ML solution. Multiplanar reformatted images are provided for review. Automated exposure control, iterative reconstruction, and/or weight-based adjustment of the  mA/kV was utilized to reduce the radiation dose to as low as reasonably achievable. COMPARISON: CT 07/11/2024. CLINICAL HISTORY: Intra-abdominal abscess/fluid retention evaluation; Right lower abdominal pain, perforated appendicitis with intra-abdominal abscesses; status post right pelvic abscess drain via transgluteal approach on 07/12/2024. FINDINGS: LOWER CHEST: No acute abnormality. LIVER: The liver is unremarkable. GALLBLADDER AND BILE DUCTS: Gallbladder is unremarkable. No biliary ductal dilatation. SPLEEN: No acute abnormality. PANCREAS: No acute abnormality. ADRENAL GLANDS: No acute abnormality. KIDNEYS, URETERS AND BLADDER: No stones in the kidneys or ureters. No hydronephrosis. No perinephric or periureteral stranding. Urinary bladder is unremarkable. GI AND BOWEL: Stomach demonstrates no acute abnormality. The appendix is not well identified. There is probable small abscess at the tip of the cecum measuring 4.7 x 2.9 cm. Oral contrast transients the entirety of the small bowel into the ascending colon. No evidence of bowel obstruction or perforation. Some secondary inflammation of the sigmoid colon related to the perforation. PERITONEUM AND RETROPERITONEUM: Interval placement of a transgluteal mucous catheter. There is near complete resolution of the fluid collection previously seen in the posterior cul de sac anterior to the rectum. Several smaller collections remain in the right lower quadrant. Mild enhancement collection along the right iliac vessels measures 3.2 x 2.4 cm, slight increase from 3.0 x 1.8 cm. There is interval decrease in inflammation adjacent to the supraspinatus tip. No free air. VASCULATURE: Aorta is normal in caliber. LYMPH NODES: No lymphadenopathy.  REPRODUCTIVE ORGANS: No acute abnormality. BONES AND SOFT TISSUES: No acute osseous abnormality. No focal soft tissue abnormality. IMPRESSION: 1. Improvement in inflammatory / infectious process in the right lobe quadrant (perforated  appendicitis) 2. Near complete resolution of the fluid collection previously seen in the posterior cul de sac anterior to the rectum following interval transgluteal drain placement. 3. Several smaller right lower quadrant collections persist, including a collection along the right iliac vessels measuring 3.2 x 2.4 cm (slightly increased from 3.0 x 1.8 cm) and a probable small abscess at the tip of the cecum measuring 4.7 x 2.9 cm. 4. No evidence of bowel obstruction or perforation. Electronically signed by: Norleen Boxer MD 07/16/2024 03:54 PM EST RP Workstation: [id number]     Anti-infectives: Anti-infectives (From admission, onward)    Start     Dose/Rate Route Frequency Ordered Stop   07/18/24 1015  metroNIDAZOLE  (FLAGYL ) tablet 500 mg        500 mg Oral Every 12 hours 07/18/24 0925     07/17/24 0000  amoxicillin -clavulanate (AUGMENTIN ) 875-125 MG tablet        1 tablet Oral 2 times daily 07/17/24 0935     07/15/24 1600  cefTRIAXone  (ROCEPHIN ) 2 g in sodium chloride  0.9 % 100 mL IVPB        2 g 200 mL/hr over 30 Minutes Intravenous Every 24 hours 07/15/24 1507     07/15/24 1600  metroNIDAZOLE  (FLAGYL ) tablet 500 mg  Status:  Discontinued        500 mg Oral Every 8 hours 07/15/24 1507 07/16/24 0856   07/12/24 0300  piperacillin -tazobactam (ZOSYN ) IVPB 3.375 g  Status:  Discontinued        3.375 g 12.5 mL/hr over 240 Minutes Intravenous Every 8 hours 07/11/24 1842 07/11/24 1843   07/12/24 0100  piperacillin -tazobactam (ZOSYN ) IVPB 3.375 g  Status:  Discontinued        3.375 g 12.5 mL/hr over 240 Minutes Intravenous Every 8 hours 07/11/24 1843 07/15/24 1507   07/11/24 1815  piperacillin -tazobactam (ZOSYN ) IVPB 3.375 g        3.375 g 100 mL/hr over 30 Minutes Intravenous  Once 07/11/24 1814 07/11/24 1946        Assessment/Plan Acute perforated appendicitis with multiple abscesses and reactive SBO -SBO resolved,  WBC is stable, AF -Persistent abscess for repeat perc drain today -cont  IV abx therapy -original drain per IR.  -monitor diarrhea- check C diff if ongoing -anticipate interval colonoscopy and subsequent appy in 6-8 weeks. Will need cardiac clearance.    FEN - FLD VTE - heparin  gtt ID - zosyn   New onset a fib - now in NSR HTN  I reviewed Consultant cardiology notes, hospitalist notes, last 24 h vitals and pain scores, last 48 h intake and output, last 24 h labs and trends, and last 24 h imaging results.   LOS: 7 days    Mitzie DELENA Freund , MD [facility] 07/18/2024, 9:49 AM Please see Amion for pager number during day hours 7:00am-4:30pm or 7:00am -11:30am on weekends  "

## 2024-07-18 NOTE — Procedures (Signed)
 Interventional Radiology Procedure:   Indications: Perforated appendicitis with multiple abscesses.  Already has a transgluteal drain (Drain #1)  Procedure: Placement of two RLQ abscess drains with CT and US  guidance  Findings: Drain #2 in RLQ and removed 10 ml of thick yellow/brown fluid.  Drain #3 in RLQ and removed 10 ml of bloody purulent fluid.    Complications: None     EBL: Minimal  Plan: Fluid sent for culture.  Routine drain flushing.    Pegi Milazzo R. Philip, MD  Pager: [phone]

## 2024-07-18 NOTE — Plan of Care (Signed)

## 2024-07-19 ENCOUNTER — Other Ambulatory Visit (HOSPITAL_COMMUNITY): Payer: Self-pay | Admitting: Radiology

## 2024-07-19 ENCOUNTER — Other Ambulatory Visit (HOSPITAL_COMMUNITY): Payer: Self-pay

## 2024-07-19 DIAGNOSIS — K3533 Acute appendicitis with perforation and localized peritonitis, with abscess: Secondary | ICD-10-CM | POA: Diagnosis not present

## 2024-07-19 LAB — COMPREHENSIVE METABOLIC PANEL WITH GFR
ALT: 35 U/L (ref 0–44)
AST: 29 U/L (ref 15–41)
Albumin: 3.1 g/dL — ABNORMAL LOW (ref 3.5–5.0)
Alkaline Phosphatase: 54 U/L (ref 38–126)
Anion gap: 7 (ref 5–15)
BUN: 6 mg/dL — ABNORMAL LOW (ref 8–23)
CO2: 25 mmol/L (ref 22–32)
Calcium: 8.8 mg/dL — ABNORMAL LOW (ref 8.9–10.3)
Chloride: 103 mmol/L (ref 98–111)
Creatinine, Ser: 0.98 mg/dL (ref 0.61–1.24)
GFR, Estimated: >60 mL/min
Glucose, Bld: 119 mg/dL — ABNORMAL HIGH (ref 70–99)
Potassium: 4.1 mmol/L (ref 3.5–5.1)
Sodium: 136 mmol/L (ref 135–145)
Total Bilirubin: 0.4 mg/dL (ref 0.0–1.2)
Total Protein: 5.8 g/dL — ABNORMAL LOW (ref 6.5–8.1)

## 2024-07-19 LAB — CBC
HCT: 39.3 % (ref 39.0–52.0)
Hemoglobin: 13.2 g/dL (ref 13.0–17.0)
MCH: 33.1 pg (ref 26.0–34.0)
MCHC: 33.6 g/dL (ref 30.0–36.0)
MCV: 98.5 fL (ref 80.0–100.0)
Platelets: 331 K/uL (ref 150–400)
RBC: 3.99 MIL/uL — ABNORMAL LOW (ref 4.22–5.81)
RDW: 12.6 % (ref 11.5–15.5)
WBC: 9.5 K/uL (ref 4.0–10.5)
nRBC: 0 % (ref 0.0–0.2)

## 2024-07-19 LAB — HEPARIN LEVEL (UNFRACTIONATED): Heparin Unfractionated: 0.64 [IU]/mL (ref 0.30–0.70)

## 2024-07-19 LAB — APTT
aPTT: 118 s — ABNORMAL HIGH (ref 24–36)
aPTT: 109 s — ABNORMAL HIGH (ref 24–36)

## 2024-07-19 MED ORDER — APIXABAN 5 MG PO TABS
5.0000 mg | ORAL_TABLET | Freq: Two times a day (BID) | ORAL | 0 refills | Status: AC
  Filled 2024-07-19: qty 60, 30d supply, fill #0

## 2024-07-19 MED ORDER — METRONIDAZOLE 500 MG PO TABS
500.0000 mg | ORAL_TABLET | Freq: Three times a day (TID) | ORAL | 0 refills | Status: AC
  Filled 2024-07-19: qty 15, 5d supply, fill #0

## 2024-07-19 MED ORDER — APIXABAN 5 MG PO TABS
5.0000 mg | ORAL_TABLET | Freq: Two times a day (BID) | ORAL | Status: DC
  Administered 2024-07-19: 5 mg via ORAL
  Filled 2024-07-19: qty 1

## 2024-07-19 MED ORDER — METOPROLOL SUCCINATE ER 50 MG PO TB24
50.0000 mg | ORAL_TABLET | Freq: Every day | ORAL | 11 refills | Status: DC
  Filled 2024-07-19: qty 30, 30d supply, fill #0

## 2024-07-19 MED ORDER — CIPROFLOXACIN HCL 500 MG PO TABS
500.0000 mg | ORAL_TABLET | Freq: Two times a day (BID) | ORAL | 0 refills | Status: AC
  Filled 2024-07-19: qty 10, 5d supply, fill #0

## 2024-07-19 NOTE — Care Management Important Message (Signed)
 Important Message  Patient Details IM Letter given. Name: CLARION MOONEYHAN MRN: [id number] Date of Birth: [birth year]   Important Message Given:  Yes - Medicare IM     [name] 07/19/2024, 2:37 PM

## 2024-07-19 NOTE — Progress Notes (Addendum)
 "      Subjective: Wants to go home. Diarrhea resolved.   Objective: Vital signs in last 24 hours: Temp:  [97.5 F (36.4 C)-98.4 F (36.9 C)] 98 F (36.7 C) (01/20 0550) Pulse Rate:  [67-73] 67 (01/20 0550) Resp:  [15-16] 15 (01/20 0550) BP: (112-123)/(55-62) 123/62 (01/20 0550) SpO2:  [94 %-97 %] 96 % (01/20 0550) Last BM Date : 07/18/24  Intake/Output from previous day: 01/19 0701 - 01/20 0700 In: 1076.4 [P.O.:920; I.V.:26.4; IV Piggyback:100] Out: 957 [Urine:900; Drains:57] Intake/Output this shift: Total I/O In: 566.8 [P.O.:240; I.V.:326.8] Out: -   PE: Gen: NAD Abd: soft, nontender, IR drains with serous fluid, 57 cc total yesterday)  Lab Results:  Recent Labs    07/18/24 0351 07/19/24 0740  WBC 11.5* 9.5  HGB 12.7* 13.2  HCT 38.1* 39.3  PLT 302 331   BMET Recent Labs    07/19/24 0740  NA 136  K 4.1  CL 103  CO2 25  GLUCOSE 119*  BUN 6*  CREATININE 0.98  CALCIUM 8.8*    PT/INR No results for input(s): LABPROT, INR in the last 72 hours.  CMP     Component Value Date/Time   NA 136 07/19/2024 0740   K 4.1 07/19/2024 0740   CL 103 07/19/2024 0740   CO2 25 07/19/2024 0740   GLUCOSE 119 (H) 07/19/2024 0740   BUN 6 (L) 07/19/2024 0740   CREATININE 0.98 07/19/2024 0740   CALCIUM 8.8 (L) 07/19/2024 0740   PROT 5.8 (L) 07/19/2024 0740   ALBUMIN 3.1 (L) 07/19/2024 0740   AST 29 07/19/2024 0740   ALT 35 07/19/2024 0740   ALKPHOS 54 07/19/2024 0740   BILITOT 0.4 07/19/2024 0740   GFRNONAA >60 07/19/2024 0740   Lipase     Component Value Date/Time   LIPASE 17 07/11/2024 1521       Studies/Results: CT GUIDED VISCERAL FLUID DRAIN BY PERC CATH Result Date: 07/18/2024 INDICATION: 74 year old with perforated appendicitis and multiple abscess. Patient already has a transgluteal pelvic drain (Drain #1). Residual abscess collections in the right lower quadrant. EXAM: 1. Image guided drain placement in a periappendiceal abscess 2. Image guided  drain placement in a right lower quadrant abscess MEDICATIONS: Moderate sedation ANESTHESIA/SEDATION: Moderate (conscious) sedation was employed during this procedure. A total of Versed  1mg  and fentanyl  50 mcg was administered intravenously at the order of the provider performing the procedure. Total intra-service moderate sedation time: 60 minutes. Patient's level of consciousness and vital signs were monitored continuously by radiology nurse throughout the procedure under the supervision of the provider performing the procedure. COMPLICATIONS: None immediate. PROCEDURE: Informed written consent was obtained from the patient after a thorough discussion of the procedural risks, benefits and alternatives. All questions were addressed. Maximal Sterile Barrier Technique was utilized including caps, mask, sterile gowns, sterile gloves, sterile drape, hand hygiene and skin antiseptic. A timeout was performed prior to the initiation of the procedure. Patient was placed supine on the CT scanner. Images through the lower abdomen and pelvis were obtained. Additional CT images were obtained following the administration intravenous contrast (75 mL Omnipaque  300). The right lower quadrant was also evaluated with ultrasound. Right lower quadrant of the abdomen was prepped with chlorhexidine and sterile field was created. Skin was anesthetized using 1% lidocaine . Small incision was made. Using ultrasound guidance, an 18 gauge needle was directed into the small fluid collection in the periappendiceal region. Thick yellow purulent fluid was aspirated. Superstiff Amplatz wire was advanced into the collection  and the tract was dilated to accommodate a 10 French multipurpose drain. 10 mL of yellow/brown thick purulent fluid was aspirated. This drain was labeled as Drain #2 since the patient already has a drain in the transgluteal region. Attention was directed to an additional fluid collection more caudal in the right lower quadrant.  This area was anesthetized with 1% lidocaine  and a small incision was made. Using ultrasound guidance, an 18 gauge needle was directed into this collection. Superstiff Amplatz wire was placed. Wire placement was confirmed with ultrasound and CT. Tract was dilated to accommodate another 10 French multipurpose drain. However, no significant fluid could be aspirated from the drain and ultrasound demonstrated that the drain was not in the collection. Drain was completely removed. The right lower quadrant fluid collection was punctured again using ultrasound guidance. Wire placement was again confirmed with ultrasound guidance. The tract was dilated to accommodate a 10 French multipurpose drain. Follow up CT images demonstrated that the drain advanced through the collection. Drain was pulled back using CT and ultrasound guidance. Approximately 10 mL of thick bloody fluid was aspirated from the collection. Drain was labeled as drain #3. Both drains were flushed with saline and attached to suction bulbs. Both drains were sutured to the skin. RADIATION DOSE REDUCTION: This exam was performed according to the departmental dose-optimization program which includes automated exposure control, adjustment of the mA and/or kV according to patient size and/or use of iterative reconstruction technique. FINDINGS: CT images demonstrated multiple small peripherally enhancing fluid collections in the lower abdomen. The collection in the periappendiceal area was targeted. Drain (#2) was placed in this collection and 10 mL of thick yellow/brown fluid was aspirated. This collection was decompressed at the end of the procedure. Second fluid collection in the right lower quadrant was targeted. Drain (#3) was placed within this collection and 10 mL of thick bloody fluid was aspirated. IMPRESSION: 1. Image guided placement of a drain within the periappendiceal abscess, labeled as drain #2. 2. Image guided placement of a drain within a right  lower quadrant abscess, labeled as drain #3. Electronically Signed   By: Juliene Balder M.D.   On: 07/18/2024 16:15   CT GUIDED VISCERAL FLUID DRAIN BY PERC CATH Result Date: 07/18/2024 INDICATION: 74 year old with perforated appendicitis and multiple abscess. Patient already has a transgluteal pelvic drain (Drain #1). Residual abscess collections in the right lower quadrant. EXAM: 1. Image guided drain placement in a periappendiceal abscess 2. Image guided drain placement in a right lower quadrant abscess MEDICATIONS: Moderate sedation ANESTHESIA/SEDATION: Moderate (conscious) sedation was employed during this procedure. A total of Versed  1mg  and fentanyl  50 mcg was administered intravenously at the order of the provider performing the procedure. Total intra-service moderate sedation time: 60 minutes. Patient's level of consciousness and vital signs were monitored continuously by radiology nurse throughout the procedure under the supervision of the provider performing the procedure. COMPLICATIONS: None immediate. PROCEDURE: Informed written consent was obtained from the patient after a thorough discussion of the procedural risks, benefits and alternatives. All questions were addressed. Maximal Sterile Barrier Technique was utilized including caps, mask, sterile gowns, sterile gloves, sterile drape, hand hygiene and skin antiseptic. A timeout was performed prior to the initiation of the procedure. Patient was placed supine on the CT scanner. Images through the lower abdomen and pelvis were obtained. Additional CT images were obtained following the administration intravenous contrast (75 mL Omnipaque  300). The right lower quadrant was also evaluated with ultrasound. Right lower quadrant of the abdomen  was prepped with chlorhexidine and sterile field was created. Skin was anesthetized using 1% lidocaine . Small incision was made. Using ultrasound guidance, an 18 gauge needle was directed into the small fluid collection  in the periappendiceal region. Thick yellow purulent fluid was aspirated. Superstiff Amplatz wire was advanced into the collection and the tract was dilated to accommodate a 10 French multipurpose drain. 10 mL of yellow/brown thick purulent fluid was aspirated. This drain was labeled as Drain #2 since the patient already has a drain in the transgluteal region. Attention was directed to an additional fluid collection more caudal in the right lower quadrant. This area was anesthetized with 1% lidocaine  and a small incision was made. Using ultrasound guidance, an 18 gauge needle was directed into this collection. Superstiff Amplatz wire was placed. Wire placement was confirmed with ultrasound and CT. Tract was dilated to accommodate another 10 French multipurpose drain. However, no significant fluid could be aspirated from the drain and ultrasound demonstrated that the drain was not in the collection. Drain was completely removed. The right lower quadrant fluid collection was punctured again using ultrasound guidance. Wire placement was again confirmed with ultrasound guidance. The tract was dilated to accommodate a 10 French multipurpose drain. Follow up CT images demonstrated that the drain advanced through the collection. Drain was pulled back using CT and ultrasound guidance. Approximately 10 mL of thick bloody fluid was aspirated from the collection. Drain was labeled as drain #3. Both drains were flushed with saline and attached to suction bulbs. Both drains were sutured to the skin. RADIATION DOSE REDUCTION: This exam was performed according to the departmental dose-optimization program which includes automated exposure control, adjustment of the mA and/or kV according to patient size and/or use of iterative reconstruction technique. FINDINGS: CT images demonstrated multiple small peripherally enhancing fluid collections in the lower abdomen. The collection in the periappendiceal area was targeted. Drain (#2)  was placed in this collection and 10 mL of thick yellow/brown fluid was aspirated. This collection was decompressed at the end of the procedure. Second fluid collection in the right lower quadrant was targeted. Drain (#3) was placed within this collection and 10 mL of thick bloody fluid was aspirated. IMPRESSION: 1. Image guided placement of a drain within the periappendiceal abscess, labeled as drain #2. 2. Image guided placement of a drain within a right lower quadrant abscess, labeled as drain #3. Electronically Signed   By: Juliene Balder M.D.   On: 07/18/2024 16:15     Anti-infectives: Anti-infectives (From admission, onward)    Start     Dose/Rate Route Frequency Ordered Stop   07/18/24 1015  metroNIDAZOLE  (FLAGYL ) tablet 500 mg        500 mg Oral Every 12 hours 07/18/24 0925     07/17/24 0000  amoxicillin -clavulanate (AUGMENTIN ) 875-125 MG tablet        1 tablet Oral 2 times daily 07/17/24 0935     07/15/24 1600  cefTRIAXone  (ROCEPHIN ) 2 g in sodium chloride  0.9 % 100 mL IVPB        2 g 200 mL/hr over 30 Minutes Intravenous Every 24 hours 07/15/24 1507     07/15/24 1600  metroNIDAZOLE  (FLAGYL ) tablet 500 mg  Status:  Discontinued        500 mg Oral Every 8 hours 07/15/24 1507 07/16/24 0856   07/12/24 0300  piperacillin -tazobactam (ZOSYN ) IVPB 3.375 g  Status:  Discontinued        3.375 g 12.5 mL/hr over 240 Minutes Intravenous Every 8  hours 07/11/24 1842 07/11/24 1843   07/12/24 0100  piperacillin -tazobactam (ZOSYN ) IVPB 3.375 g  Status:  Discontinued        3.375 g 12.5 mL/hr over 240 Minutes Intravenous Every 8 hours 07/11/24 1843 07/15/24 1507   07/11/24 1815  piperacillin -tazobactam (ZOSYN ) IVPB 3.375 g        3.375 g 100 mL/hr over 30 Minutes Intravenous  Once 07/11/24 1814 07/11/24 1946        Assessment/Plan Acute perforated appendicitis with multiple abscesses and reactive SBO -SBO resolved,  WBC is stable, AF -Additional drains x 2 placed 1/19 -transition to PO abx- Dr.  Sheldon has rx'd 5 day course of augmentin  at discharge, -original drain per IR.  -anticipate interval colonoscopy and subsequent appy in 6-8 weeks. Will need cardiac clearance.    FEN - heart healthy VTE - heparin  gtt, ok for PO meds at this point ID - [email or web address]   New onset a fib - now in NSR HTN  I reviewed Consultant cardiology notes, hospitalist notes, last 24 h vitals and pain scores, last 48 h intake and output, last 24 h labs and trends, and last 24 h imaging results.   LOS: 8 days    Luke Dalton , MD [facility] Surgery 07/19/2024, 12:30 PM Please see Amion for pager number during day hours 7:00am-4:30pm or 7:00am -11:30am on weekends  "

## 2024-07-19 NOTE — Progress Notes (Signed)
 PHARMACY - ANTICOAGULATION CONSULT NOTE  Pharmacy Consult for heparin   Indication: atrial fibrillation  Allergies[1]  Patient Measurements: Height: 5' 10 (177.8 cm) Weight: 84.6 kg (186 lb 8.2 oz) IBW/kg (Calculated) : 73 HEPARIN  DW (KG): 84.6  Vital Signs: Temp: 98 F (36.7 C) (01/20 0550) Temp Source: Oral (01/20 0550) BP: 123/62 (01/20 0550) Pulse Rate: 67 (01/20 0550)  Labs: Recent Labs    07/17/24 0408 07/17/24 2048 07/18/24 0351 07/18/24 2219 07/19/24 0740  HGB 12.9*  --  12.7*  --  13.2  HCT 38.9*  --  38.1*  --  39.3  PLT 289  --  302  --  331  APTT  --  96*  --  118* 109*  HEPARINUNFRC  --   --   --  0.54 0.64  CREATININE  --   --   --   --  0.98    Estimated Creatinine Clearance: 68.3 mL/min (by C-G formula based on SCr of 0.98 mg/dL).  Assessment: Patient is a [name] who presented to the ED on 07/11/24 with c/o abdominal pain.  He was found to be in afib and abdominal CT showed perforated appendicitis with multiple pelvic abscesses. CCS recommended perc drains for abscess.  Pharmacy has been consulted to dose heparin  for afib.  Significant Events: 1/13: Heparin  held at 5a for IR drain placement and resume post-procedure ~4p 1/14-15: Heparin  held due to coffee - ground blood in NGT, has since been removed per RN  1/16: Transitioned to apixaban  (LD 1/17 @0830 ) 1/18: Pharmacy consulted to restart heparin  in anticipation for drain placement in residual RLQ fluid collection 1/19: IV Heparin  held this am and two RLQ abscess drains placed by IR, ok per IR to restart heparin  2 hours after procedure   Today, 07/19/2024: aPTT remains slightly elevated following decrease in heparin  infusion rate to 1550 units/hr. However, heparin  level (which should be falsely elevated from recent Eliquis ) is actually with goal range, albeit rising to borderline high CBC WNL SCr slightly elevated earlier this admission; now WNL <1.0 No bleeding noted per RN  Goal of Therapy:   Heparin  level 0.3-0.7 units/ml (HL) aPTT goal 66-102 sec Monitor platelets by anticoagulation protocol: Yes   Plan:  Decrease heparin  drip to 1500 units/hr, given borderline elevated and trending up, also recent/frequent procedures and ?UGIB Given minimal rate change; defer repeat heparin  level to AM labs No further aPTTs needed, given heparin  levels relatively lower than aPTTs, despite recent DOAC (anti-Xa levels generally preferred over aPTTs d/t greater accuracy, if no confounders present) Daily CBC & heparin  level Follow up anticoagulation plans after procedures completed   Thank you for allowing pharmacy to be a part of this patients care.  Bard Jeans, PharmD, BCPS [phone] 07/19/2024, 11:02 AM    [1]  Allergies Allergen Reactions   Losartan     Other Reaction(s): tachycardia, hypotension at 50 mg dose

## 2024-07-19 NOTE — Progress Notes (Addendum)
 PHARMACY - ANTICOAGULATION CONSULT NOTE  Pharmacy Consult for heparin   Indication: atrial fibrillation  Allergies[1]  Patient Measurements: Height: 5' 10 (177.8 cm) Weight: 84.6 kg (186 lb 8.2 oz) IBW/kg (Calculated) : 73 HEPARIN  DW (KG): 84.6  Vital Signs: Temp: 98.4 F (36.9 C) (01/19 2002) Temp Source: Oral (01/19 2002) BP: 112/55 (01/19 2002) Pulse Rate: 73 (01/19 2002)  Labs: Recent Labs    07/16/24 0356 07/17/24 0408 07/17/24 2048 07/18/24 0351 07/18/24 2219  HGB 12.8* 12.9*  --  12.7*  --   HCT 38.7* 38.9*  --  38.1*  --   PLT 295 289  --  302  --   APTT  --   --  96*  --  118*  HEPARINUNFRC  --   --   --   --  0.54    Estimated Creatinine Clearance: 65 mL/min (by C-G formula based on SCr of 1.03 mg/dL).  Assessment: Patient is a [name] who presented to the ED on 07/11/24 with c/o abdominal pain.  He was found to be in afib and abdominal CT showed perforated appendicitis with multiple pelvic abscesses. CCS recom.  perc drains for abscess.  Pharmacy has been consulted to dose heparin  for afib.  Significant Events: 1/13: Heparin  held at 5a for IR drain placement and resume post-procedure ~4p 1/14-15: Heparin  held due to coffee - ground blood in NGT, has since been removed per RN  1/16: Transitioned to apixaban  (LD 1/17 @0830 ) 1/18: Pharmacy consulted to restart heparin  in anticipation for drain placement in residual RLQ fluid collection 1/19: IV Heparin  held this am and two RLQ abscess drains placed by IR, ok per IR to restart heparin  2 hours after procedure   Today, 07/19/2024: aPTT 118 supra-therapeutic on 1700 units/hr No bleeding noted per RN  Goal of Therapy:  Heparin  level 0.3-0.7 units/ml (HL) aPTT goal 66-102 sec Monitor platelets by anticoagulation protocol: Yes   Plan:  Decrease heparin  drip to 1550 units/hr 8 hr aPTT & HL Monitor daily aPTT & HL until correlating then monitor using only HL, CBC, signs/symptoms of bleeding Follow up  anticoagulation plans post procedures   Thank you for allowing pharmacy to be a part of this patients care.  Leeroy Mace RPh 07/19/2024, 12:33 AM             [1]  Allergies Allergen Reactions   Losartan     Other Reaction(s): tachycardia, hypotension at 50 mg dose

## 2024-07-19 NOTE — Progress Notes (Signed)
 Discharge meds in a secure bag delivered to patient by this RN

## 2024-07-19 NOTE — Progress Notes (Signed)
 "   Referring Physician(s): Dr. True Atlas  Supervising Physician: Luverne Aran  Patient Status:  [facility] - In-pt  Chief Complaint:  Perforated appendix with subsequent abscess formation s/p IR intra abdominal / pelvic abscesses X 3.  Subjective:  Patient seen at bedside. No questions or concerns. States the would like to know he is being discharged.  Allergies: Losartan  Medications: Prior to Admission medications  Medication Sig Start Date End Date Taking? Authorizing Provider  amoxicillin -clavulanate (AUGMENTIN ) 875-125 MG tablet Take 1 tablet by mouth 2 (two) times daily. 07/17/24  Yes Sheldon Standing, MD  Ascorbic Acid (VITAMIN C) 1000 MG tablet Take 1,000 mg by mouth daily.   Yes [provider]  Cholecalciferol (VITAMIN D3) 50 MCG (2000 UT) TABS Take 1 tablet by mouth daily.   Yes [provider]  cyanocobalamin  (VITAMIN B12) 500 MCG tablet Take 500 mcg by mouth daily.   Yes [provider]  DULoxetine  (CYMBALTA ) 30 MG capsule Take 1 capsule (30 mg total) by mouth 2 (two) times daily. 07/06/24  Yes Patel, Donika K, DO  famotidine (PEPCID) 40 MG tablet Take 40 mg by mouth as needed for heartburn.   Yes [provider]  losartan (COZAAR) 25 MG tablet Take 25 mg by mouth daily.   Yes [provider]  montelukast  (SINGULAIR ) 10 MG tablet Take 10 mg by mouth every morning.   Yes [provider]  Omega-3 Fatty Acids (FISH OIL PO) Take 1 capsule by mouth daily.   Yes [provider]  simvastatin  (ZOCOR ) 40 MG tablet Take 40 mg by mouth every other day. Patient taking differently: Take 20 mg by mouth daily.   Yes [provider]  tamsulosin  (FLOMAX ) 0.4 MG CAPS capsule Take 0.4 mg by mouth every other day.   Yes [provider]     Vital Signs: BP 123/62 (BP Location: Right Arm)   Pulse 67   Temp 98 F (36.7 C) (Oral)   Resp 15   Ht 5' 10 (1.778 m)   Wt 186 lb 8.2 oz (84.6 kg)   SpO2 96%   BMI  26.76 kg/m   Physical Exam Vitals and nursing note reviewed.  Constitutional:      Appearance: He is well-developed.  HENT:     Head: Normocephalic.  Pulmonary:     Effort: Pulmonary effort is normal.  Abdominal:     Comments: Positive right transgluteal  pelvic drain to  suction Site is unremarkable with no erythema, edema, tenderness, bleeding or drainage noted at exit site. Suture and  in place. Dressing is clean dry and intact. Clear yellow fluid noted to be in the line of the JP drain. Drain is able to be flushed easily. No leakage or pain with flushing.  Insertion site clean and dry.   Positive right upper quadrant superior drain to  suction. Site is unremarkable with no erythema, edema, tenderness, bleeding or drainage noted at exit site. Suture and  in place. Dressing is clean dry and intact. 10 ml of purulent fluid noted to be in JP drain. Drain is able to be flushed easily. No leakage or pain with flushing.  Insertion site clean and dry.   Positive right upper quadrant superior drain to  suction. Site is unremarkable with no erythema, edema, tenderness, bleeding or drainage noted at exit site. Suture and in place. Dressing is clean dry and intact. < 10 ml of clear yellow fluid noted to be in JP drain. Drain is able to be  flushed easily. No leakage or pain with flushing. Insertion site clean and dry.   Musculoskeletal:        General: Normal range of motion.     Cervical back: Normal range of motion.  Skin:    General: Skin is warm and dry.  Neurological:     General: No focal deficit present.     Mental Status: He is alert and oriented to person, place, and time.     Imaging: CT GUIDED VISCERAL FLUID DRAIN BY PERC CATH Result Date: 07/18/2024 INDICATION: 74 year old with perforated appendicitis and multiple abscess. Patient already has a transgluteal pelvic drain (Drain #1). Residual abscess collections in the right lower quadrant. EXAM: 1. Image guided drain placement in a  periappendiceal abscess 2. Image guided drain placement in a right lower quadrant abscess MEDICATIONS: Moderate sedation ANESTHESIA/SEDATION: Moderate (conscious) sedation was employed during this procedure. A total of Versed  1mg  and fentanyl  50 mcg was administered intravenously at the order of the provider performing the procedure. Total intra-service moderate sedation time: 60 minutes. Patient's level of consciousness and vital signs were monitored continuously by radiology nurse throughout the procedure under the supervision of the provider performing the procedure. COMPLICATIONS: None immediate. PROCEDURE: Informed written consent was obtained from the patient after a thorough discussion of the procedural risks, benefits and alternatives. All questions were addressed. Maximal Sterile Barrier Technique was utilized including caps, mask, sterile gowns, sterile gloves, sterile drape, hand hygiene and skin antiseptic. A timeout was performed prior to the initiation of the procedure. Patient was placed supine on the CT scanner. Images through the lower abdomen and pelvis were obtained. Additional CT images were obtained following the administration intravenous contrast (75 mL Omnipaque  300). The right lower quadrant was also evaluated with ultrasound. Right lower quadrant of the abdomen was prepped with chlorhexidine and sterile field was created. Skin was anesthetized using 1% lidocaine . Small incision was made. Using ultrasound guidance, an 18 gauge needle was directed into the small fluid collection in the periappendiceal region. Thick yellow purulent fluid was aspirated. Superstiff Amplatz wire was advanced into the collection and the tract was dilated to accommodate a 10 French multipurpose drain. 10 mL of yellow/brown thick purulent fluid was aspirated. This drain was labeled as Drain #2 since the patient already has a drain in the transgluteal region. Attention was directed to an additional fluid collection  more caudal in the right lower quadrant. This area was anesthetized with 1% lidocaine  and a small incision was made. Using ultrasound guidance, an 18 gauge needle was directed into this collection. Superstiff Amplatz wire was placed. Wire placement was confirmed with ultrasound and CT. Tract was dilated to accommodate another 10 French multipurpose drain. However, no significant fluid could be aspirated from the drain and ultrasound demonstrated that the drain was not in the collection. Drain was completely removed. The right lower quadrant fluid collection was punctured again using ultrasound guidance. Wire placement was again confirmed with ultrasound guidance. The tract was dilated to accommodate a 10 French multipurpose drain. Follow up CT images demonstrated that the drain advanced through the collection. Drain was pulled back using CT and ultrasound guidance. Approximately 10 mL of thick bloody fluid was aspirated from the collection. Drain was labeled as drain #3. Both drains were flushed with saline and attached to suction bulbs. Both drains were sutured to the skin. RADIATION DOSE REDUCTION: This exam was performed according to the departmental dose-optimization program which includes automated exposure control, adjustment of the mA and/or kV according  to patient size and/or use of iterative reconstruction technique. FINDINGS: CT images demonstrated multiple small peripherally enhancing fluid collections in the lower abdomen. The collection in the periappendiceal area was targeted. Drain (#2) was placed in this collection and 10 mL of thick yellow/brown fluid was aspirated. This collection was decompressed at the end of the procedure. Second fluid collection in the right lower quadrant was targeted. Drain (#3) was placed within this collection and 10 mL of thick bloody fluid was aspirated. IMPRESSION: 1. Image guided placement of a drain within the periappendiceal abscess, labeled as drain #2. 2. Image  guided placement of a drain within a right lower quadrant abscess, labeled as drain #3. Electronically Signed   By: Juliene Balder M.D.   On: 07/18/2024 16:15   CT GUIDED VISCERAL FLUID DRAIN BY PERC CATH Result Date: 07/18/2024 INDICATION: 74 year old with perforated appendicitis and multiple abscess. Patient already has a transgluteal pelvic drain (Drain #1). Residual abscess collections in the right lower quadrant. EXAM: 1. Image guided drain placement in a periappendiceal abscess 2. Image guided drain placement in a right lower quadrant abscess MEDICATIONS: Moderate sedation ANESTHESIA/SEDATION: Moderate (conscious) sedation was employed during this procedure. A total of Versed  1mg  and fentanyl  50 mcg was administered intravenously at the order of the provider performing the procedure. Total intra-service moderate sedation time: 60 minutes. Patient's level of consciousness and vital signs were monitored continuously by radiology nurse throughout the procedure under the supervision of the provider performing the procedure. COMPLICATIONS: None immediate. PROCEDURE: Informed written consent was obtained from the patient after a thorough discussion of the procedural risks, benefits and alternatives. All questions were addressed. Maximal Sterile Barrier Technique was utilized including caps, mask, sterile gowns, sterile gloves, sterile drape, hand hygiene and skin antiseptic. A timeout was performed prior to the initiation of the procedure. Patient was placed supine on the CT scanner. Images through the lower abdomen and pelvis were obtained. Additional CT images were obtained following the administration intravenous contrast (75 mL Omnipaque  300). The right lower quadrant was also evaluated with ultrasound. Right lower quadrant of the abdomen was prepped with chlorhexidine and sterile field was created. Skin was anesthetized using 1% lidocaine . Small incision was made. Using ultrasound guidance, an 18 gauge needle  was directed into the small fluid collection in the periappendiceal region. Thick yellow purulent fluid was aspirated. Superstiff Amplatz wire was advanced into the collection and the tract was dilated to accommodate a 10 French multipurpose drain. 10 mL of yellow/brown thick purulent fluid was aspirated. This drain was labeled as Drain #2 since the patient already has a drain in the transgluteal region. Attention was directed to an additional fluid collection more caudal in the right lower quadrant. This area was anesthetized with 1% lidocaine  and a small incision was made. Using ultrasound guidance, an 18 gauge needle was directed into this collection. Superstiff Amplatz wire was placed. Wire placement was confirmed with ultrasound and CT. Tract was dilated to accommodate another 10 French multipurpose drain. However, no significant fluid could be aspirated from the drain and ultrasound demonstrated that the drain was not in the collection. Drain was completely removed. The right lower quadrant fluid collection was punctured again using ultrasound guidance. Wire placement was again confirmed with ultrasound guidance. The tract was dilated to accommodate a 10 French multipurpose drain. Follow up CT images demonstrated that the drain advanced through the collection. Drain was pulled back using CT and ultrasound guidance. Approximately 10 mL of thick bloody fluid was aspirated from  the collection. Drain was labeled as drain #3. Both drains were flushed with saline and attached to suction bulbs. Both drains were sutured to the skin. RADIATION DOSE REDUCTION: This exam was performed according to the departmental dose-optimization program which includes automated exposure control, adjustment of the mA and/or kV according to patient size and/or use of iterative reconstruction technique. FINDINGS: CT images demonstrated multiple small peripherally enhancing fluid collections in the lower abdomen. The collection in the  periappendiceal area was targeted. Drain (#2) was placed in this collection and 10 mL of thick yellow/brown fluid was aspirated. This collection was decompressed at the end of the procedure. Second fluid collection in the right lower quadrant was targeted. Drain (#3) was placed within this collection and 10 mL of thick bloody fluid was aspirated. IMPRESSION: 1. Image guided placement of a drain within the periappendiceal abscess, labeled as drain #2. 2. Image guided placement of a drain within a right lower quadrant abscess, labeled as drain #3. Electronically Signed   By: Juliene Balder M.D.   On: 07/18/2024 16:15   CT ABDOMEN PELVIS W CONTRAST Result Date: 07/16/2024 EXAM: CT ABDOMEN AND PELVIS WITH CONTRAST 07/16/2024 11:05:33 AM TECHNIQUE: CT of the abdomen and pelvis was performed with the administration of 100 mL of iohexol  (OMNIPAQUE ) 300 MG/ML solution. Multiplanar reformatted images are provided for review. Automated exposure control, iterative reconstruction, and/or weight-based adjustment of the mA/kV was utilized to reduce the radiation dose to as low as reasonably achievable. COMPARISON: CT 07/11/2024. CLINICAL HISTORY: Intra-abdominal abscess/fluid retention evaluation; Right lower abdominal pain, perforated appendicitis with intra-abdominal abscesses; status post right pelvic abscess drain via transgluteal approach on 07/12/2024. FINDINGS: LOWER CHEST: No acute abnormality. LIVER: The liver is unremarkable. GALLBLADDER AND BILE DUCTS: Gallbladder is unremarkable. No biliary ductal dilatation. SPLEEN: No acute abnormality. PANCREAS: No acute abnormality. ADRENAL GLANDS: No acute abnormality. KIDNEYS, URETERS AND BLADDER: No stones in the kidneys or ureters. No hydronephrosis. No perinephric or periureteral stranding. Urinary bladder is unremarkable. GI AND BOWEL: Stomach demonstrates no acute abnormality. The appendix is not well identified. There is probable small abscess at the tip of the cecum  measuring 4.7 x 2.9 cm. Oral contrast transients the entirety of the small bowel into the ascending colon. No evidence of bowel obstruction or perforation. Some secondary inflammation of the sigmoid colon related to the perforation. PERITONEUM AND RETROPERITONEUM: Interval placement of a transgluteal mucous catheter. There is near complete resolution of the fluid collection previously seen in the posterior cul de sac anterior to the rectum. Several smaller collections remain in the right lower quadrant. Mild enhancement collection along the right iliac vessels measures 3.2 x 2.4 cm, slight increase from 3.0 x 1.8 cm. There is interval decrease in inflammation adjacent to the supraspinatus tip. No free air. VASCULATURE: Aorta is normal in caliber. LYMPH NODES: No lymphadenopathy. REPRODUCTIVE ORGANS: No acute abnormality. BONES AND SOFT TISSUES: No acute osseous abnormality. No focal soft tissue abnormality. IMPRESSION: 1. Improvement in inflammatory / infectious process in the right lobe quadrant (perforated appendicitis) 2. Near complete resolution of the fluid collection previously seen in the posterior cul de sac anterior to the rectum following interval transgluteal drain placement. 3. Several smaller right lower quadrant collections persist, including a collection along the right iliac vessels measuring 3.2 x 2.4 cm (slightly increased from 3.0 x 1.8 cm) and a probable small abscess at the tip of the cecum measuring 4.7 x 2.9 cm. 4. No evidence of bowel obstruction or perforation. Electronically signed by: Norleen  Edmunds MD 07/16/2024 03:54 PM EST RP Workstation: [id number]    Labs:  CBC: Recent Labs    07/16/24 0356 07/17/24 0408 07/18/24 0351 07/19/24 0740  WBC 11.0* 11.7* 11.5* 9.5  HGB 12.8* 12.9* 12.7* 13.2  HCT 38.7* 38.9* 38.1* 39.3  PLT 295 289 302 331    COAGS: Recent Labs    07/12/24 1130 07/17/24 2048 07/18/24 2219  INR 1.1  --   --   APTT  --  96* 118*    BMP: Recent Labs     07/12/24 0642 07/13/24 0029 07/14/24 0419 07/14/24 1427  NA 136 138 142 143  K 3.9 3.3* 3.9 3.8  CL 98 102 107 107  CO2 24 23 22 23   GLUCOSE 120* 100* 93 89  BUN 34* 34* 27* 24*  CALCIUM 9.3 9.2 9.1 8.8*  CREATININE 1.29* 1.22 1.12 1.03  GFRNONAA 58* >60 >60 >60    LIVER FUNCTION TESTS: Recent Labs    07/11/24 1521 07/13/24 0029 07/14/24 0419 07/14/24 1427  BILITOT 1.3* 1.4* 1.1 1.1  AST 102* 49* 31 31  ALT 128* 92* 60* 55*  ALKPHOS 102 85 74 79  PROT 7.8 6.6 6.4* 6.1*  ALBUMIN 3.8 3.2* 3.2* 3.1*    Assessment and Plan:  74 y.o. male inpatient. History of perforated appendicitis. Found to have new onset of a fib and perforated appendicitis with pelvic abscesses and SBO. Surgery was consulted and recommended IR pelvic abscess drain placement.   Drain Location: Pelvic drain via a right transgluteal approach Size: Fr size: 10 Fr Date of placement: 1.13.26  Currently to: Drain collection device: suction bulb Clear yellow fluid noted in the line of the drain  Drain Location: RLQ - superior Size: Fr size: 10 Fr Date of placement: 1.19.26  Currently to: Drain collection device: suction bulb 10 ml of purulent output  Drain Location: RLQ  -  inferior Size: Fr size: 10 Fr Date of placement: 1.19.26  Currently to: Drain collection device: suction bulb  < 10 ml of clear yellow output  24 hour output:  Output by Drain (mL) 07/17/24 0700 - 07/17/24 1459 07/17/24 1500 - 07/17/24 2259 07/17/24 2300 - 07/18/24 0659 07/18/24 0700 - 07/18/24 1459 07/18/24 1500 - 07/18/24 2259 07/18/24 2300 - 07/19/24 0659 07/19/24 0700 - 07/19/24 0923  Closed System Drain Inferior;Right Back Bulb (JP) 10 Fr.  20 10  10 2    Closed System Drain 2 Right RUQ Bulb (JP) 10 Fr.    10 5 5    Closed System Drain 3 Lateral;Right RLQ Bulb (JP) 10 Fr.    10 10 5    Drain 1 culture grew e.coli Drain 2 & 3 culture is pending  Interval imaging/drain manipulation:  None since most recent drain  placement  Current examination: Flushes/aspirates easily.  Insertion site unremarkable. Suture and stat lock in place. Dressed appropriately.   Plan: Continue TID flushes with 5 cc NS. Record output Q shift. Dressing changes QD or PRN if soiled.  Call IR APP or on call IR MD if difficulty flushing or sudden change in drain output.  Repeat imaging/possible drain injection once output < 10 mL/QD (excluding flush material). Consideration for drain removal if output is < 10 mL/QD (excluding flush material), pending discussion with the providing surgical service.  Discharge planning: Please contact IR APP or on call IR MD prior to patient d/c to ensure appropriate follow up plans are in place. Typically patient will follow up with IR clinic 10-14 days post d/c for repeat  imaging/possible drain injection. IR scheduler will contact patient with date/time of appointment. Patient will need to flush drain QD with 5 cc NS, record output QD, dressing changes every 2-3 days or earlier if soiled.   IR will continue to follow - please call with questions or concerns.  Electronically Signed: Delon JAYSON Beagle, NP 07/19/2024, 9:17 AM   I spent a total of 15 Minutes at the patient's bedside AND on the patient's hospital floor or unit, greater than 50% of which was counseling/coordinating care for pelvic/ intra abdominal abscess drains X 3      "

## 2024-07-19 NOTE — Progress Notes (Signed)
 Demonstrated to pt and wife care of percutaneous drains including how to flush, empty, measure, and site care (dressing changes). Both pt and wife verbalized and demonstrated understanding. Reviewed discharge instructions with pt including medications, follow-up appointments, and reinforced drain care teaching. Pt verbalized understanding of all instructions. All questions answered. Kripa Foskey, Lonell Louder, RN

## 2024-07-19 NOTE — Discharge Summary (Addendum)
 Physician Discharge Summary  Luke Dalton FMW:[id number] DOB: 1951-02-26 DOA: 07/11/2024  PCP: Chrystal Lamarr RAMAN, MD  Admit date: 07/11/2024 Discharge date: 07/19/2024 Discharging to: home Recommendations for Outpatient Follow-up:  Will need event monitor- cardiology plans for this Losartan replaced with Metoprolol     Consults:  General surgery IR Cardiology     Discharge Diagnoses:   Principal Problem:   Acute appendicitis with perforation, localized peritonitis, and abscesses Active Problems:  Bacteroids bacteremia     PAF (paroxysmal atrial fibrillation) (HCC)   AKI (acute kidney injury)   Transaminitis   Preoperative cardiovascular examination      Brief hospital course: 74 y.o. male with PMH of f hypertension, dyslipidemia, BPH and spinal stenosis s/p of steroid injection a week ago came to ED abdominal pain RLQ sicne Wednesday 07/06/24 with associated nausea, some diarrhea.  The patient states that the pain was on and off on Wednesday and seem to improve with heat and ice.  However, on the following day the pain  became severe and constant.  He was following up with his neurosurgeon on Monday who evaluated him and recommended that he go to the emergency department to have his appendix assessed.   In ED A-fib/flutter (new) WBC 20.2 Cr 1.41 Bicarb 21 with AG of 18 Mildly elevated LFTs U sp gravity> 1.046   CT a/p Perforated appendix with abd/ pelvic abscesses, some SBO 1/13- transgluteal drain by IR- 150 cc of purulent fluid   Subjective:  No pain in RLQ. No complaints.    Assessment and Plan: Principal Problem:   Perforated appendix resulting in multiple intra-abdominal abscesses, reactive SBO - general surgery managing - R gluteal drain present and IR following - appendectomy planned for 6-8 wks from now  - colonoscopy in 6-8 wks to r/o malignancy - WBC 20.2> 11.5 - Culture reveals abundant E. coli, abundant Bacteroides fragilis, beta-lactamase  positive -  1/16- Zosyn  changed to Rocephin  + Flagyl  by pharmacy - 1/19- RLQ drains x 2 placed- 10 cc of fluid removed- purulent in 2nd drain- abundant WBC on gram stain on both drains- few E coli on Syringe A- 57 cc output since inserted - WBC now 9.5 - per general surgery , ok to dc with 5 more days of antibiotics - change to Cipro  and Flagyl  based on sensitivities  - IR will f/u on the drains   Active Problems:   Bacteremia with bacteroid fragilis - present in both sets of blood cultures - see above    1/4 bottles growing  staph capitis -likely contaminant     New onset atrial flutter - heparin  infusion started- held on 1/15  for blood in NG - Metoprolol  50 mg BID - 2D echo reveals an EF of 60 to 65%, indeterminant diastolic filling pressure due to E-A fusion - Patient was asymptomatic - Appreciate cardiology eval - Converted back to sinus rhythm - Eliquis  started 1/16 , then held - transitioned back to Heparin  for drain placement on 1/19 - 1/19 is back in A-fib- rate controlled -   - will need outpt monitor- cardiology signed off - 1/20- Sinus rhythm first degree AV block- transition Metoprolol  to Toprol  today- ok to resume Eliquis  per general surgery   Hypokalemia - Replaced   Elevated LFTs - Possibly secondary to perforated appendix - normalized  - No old labs to compare with.     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   apixaban  5 MG Tabs tablet Commonly known as: ELIQUIS  Take 1 tablet (5 mg total) by mouth 2 (two) times daily.   cyanocobalamin  500 MCG tablet Commonly known as: VITAMIN B12 Take 500 mcg by mouth daily.   DULoxetine  30 MG capsule Commonly known as: Cymbalta  Take 1 capsule (30 mg total) by mouth 2 (two) times daily.   famotidine 40 MG tablet Commonly known as: PEPCID Take 40 mg by mouth as needed for heartburn.   FISH OIL PO Take 1 capsule by mouth daily.   metoprolol  succinate 50 MG 24 hr tablet Commonly known as: Toprol  XL Take 1 tablet (50 mg total) by mouth daily. Take with or immediately following a meal.   montelukast  10 MG tablet Commonly known as: SINGULAIR  Take 10 mg by mouth every morning.   simvastatin  40 MG tablet Commonly known as: ZOCOR  Take 40 mg by mouth every other day. What changed:  how much to take when to take this   tamsulosin  0.4 MG Caps capsule Commonly known as: FLOMAX  Take 0.4 mg by mouth every other day.   vitamin C 1000 MG tablet Take 1,000 mg by mouth daily.   Vitamin D3 50 MCG (2000 UT) Tabs Take 1 tablet by mouth daily.        Follow-up Information     gastroenterology Follow up in 1 month(s).   Why: need to follow up with GI for a new colonoscopy prior to surgery        Sheldon Standing, MD Follow up on 08/01/2024.   Specialties: General Surgery, Colon and Rectal Surgery Why: 1:30pm, Arrive 30 minutes prior to your appointment time, Please bring your insurance card and photo ID Contact information: [address] [place] [address] [phone]         Jennefer Ester PARAS, MD Follow up in 1 week(s).   Specialties: Interventional Radiology, Diagnostic Radiology, Radiology Contact information: [address] [place] [address] [phone]                    The results of significant diagnostics from this hospitalization (including imaging, microbiology, ancillary and laboratory) are listed below for reference.    CT GUIDED VISCERAL FLUID DRAIN BY PERC CATH Result Date: 07/18/2024 INDICATION: 74 year old with perforated appendicitis and multiple abscess. Patient already has a transgluteal pelvic drain (Drain #1). Residual abscess collections in the right lower quadrant. EXAM: 1. Image guided drain placement in a periappendiceal abscess 2. Image  guided drain placement in a right lower quadrant abscess MEDICATIONS: Moderate sedation ANESTHESIA/SEDATION: Moderate (conscious) sedation was employed during this procedure. A total of Versed  1mg  and fentanyl  50 mcg was administered intravenously at the order of the provider performing the procedure. Total intra-service moderate sedation time: 60 minutes. Patient's level of consciousness and vital signs were monitored continuously by radiology nurse throughout the procedure under the supervision of the provider performing the procedure. COMPLICATIONS: None immediate. PROCEDURE: Informed written consent was obtained from the patient after a thorough discussion of the procedural risks, benefits and alternatives. All questions were addressed. Maximal Sterile Barrier Technique was utilized including caps, mask, sterile gowns, sterile gloves, sterile drape, hand hygiene and skin antiseptic. A timeout was performed prior to the initiation of the procedure. Patient was placed supine on the CT scanner. Images through the lower abdomen and pelvis were obtained. Additional CT images were obtained following the administration intravenous contrast (75  mL Omnipaque  300). The right lower quadrant was also evaluated with ultrasound. Right lower quadrant of the abdomen was prepped with chlorhexidine and sterile field was created. Skin was anesthetized using 1% lidocaine . Small incision was made. Using ultrasound guidance, an 18 gauge needle was directed into the small fluid collection in the periappendiceal region. Thick yellow purulent fluid was aspirated. Superstiff Amplatz wire was advanced into the collection and the tract was dilated to accommodate a 10 French multipurpose drain. 10 mL of yellow/brown thick purulent fluid was aspirated. This drain was labeled as Drain #2 since the patient already has a drain in the transgluteal region. Attention was directed to an additional fluid collection more caudal in the right lower quadrant. This area was anesthetized with 1% lidocaine  and a small incision was made. Using ultrasound guidance, an 18 gauge needle was directed into this collection. Superstiff Amplatz wire was placed. Wire placement was confirmed with ultrasound and CT. Tract was dilated to accommodate another 10 French multipurpose drain. However, no significant fluid could be aspirated from the drain and ultrasound demonstrated that the drain was not in the collection. Drain was completely removed. The right lower quadrant fluid collection was punctured again using ultrasound guidance. Wire placement was again confirmed with ultrasound guidance. The tract was dilated to accommodate a 10 French multipurpose drain. Follow up CT images demonstrated that the drain advanced through the collection. Drain was pulled back using CT and ultrasound guidance. Approximately 10 mL of thick bloody fluid was aspirated from the collection. Drain was labeled as drain #3. Both drains were flushed with saline and attached to suction bulbs. Both drains were sutured to the skin. RADIATION DOSE REDUCTION: This exam was performed according to the departmental dose-optimization  program which includes automated exposure control, adjustment of the mA and/or kV according to patient size and/or use of iterative reconstruction technique. FINDINGS: CT images demonstrated multiple small peripherally enhancing fluid collections in the lower abdomen. The collection in the periappendiceal area was targeted. Drain (#2) was placed in this collection and 10 mL of thick yellow/brown fluid was aspirated. This collection was decompressed at the end of the procedure. Second fluid collection in the right lower quadrant was targeted. Drain (#3) was placed within this collection and 10 mL of thick bloody fluid was aspirated. IMPRESSION: 1. Image guided placement of a drain within the periappendiceal abscess, labeled as drain #2. 2. Image guided placement of a drain within a right lower quadrant abscess, labeled as drain #3. Electronically Signed   By: Juliene Balder M.D.   On: 07/18/2024  16:15   CT GUIDED VISCERAL FLUID DRAIN BY PERC CATH Result Date: 07/18/2024 INDICATION: 74 year old with perforated appendicitis and multiple abscess. Patient already has a transgluteal pelvic drain (Drain #1). Residual abscess collections in the right lower quadrant. EXAM: 1. Image guided drain placement in a periappendiceal abscess 2. Image guided drain placement in a right lower quadrant abscess MEDICATIONS: Moderate sedation ANESTHESIA/SEDATION: Moderate (conscious) sedation was employed during this procedure. A total of Versed  1mg  and fentanyl  50 mcg was administered intravenously at the order of the provider performing the procedure. Total intra-service moderate sedation time: 60 minutes. Patient's level of consciousness and vital signs were monitored continuously by radiology nurse throughout the procedure under the supervision of the provider performing the procedure. COMPLICATIONS: None immediate. PROCEDURE: Informed written consent was obtained from the patient after a thorough discussion of the procedural risks,  benefits and alternatives. All questions were addressed. Maximal Sterile Barrier Technique was utilized including caps, mask, sterile gowns, sterile gloves, sterile drape, hand hygiene and skin antiseptic. A timeout was performed prior to the initiation of the procedure. Patient was placed supine on the CT scanner. Images through the lower abdomen and pelvis were obtained. Additional CT images were obtained following the administration intravenous contrast (75 mL Omnipaque  300). The right lower quadrant was also evaluated with ultrasound. Right lower quadrant of the abdomen was prepped with chlorhexidine and sterile field was created. Skin was anesthetized using 1% lidocaine . Small incision was made. Using ultrasound guidance, an 18 gauge needle was directed into the small fluid collection in the periappendiceal region. Thick yellow purulent fluid was aspirated. Superstiff Amplatz wire was advanced into the collection and the tract was dilated to accommodate a 10 French multipurpose drain. 10 mL of yellow/brown thick purulent fluid was aspirated. This drain was labeled as Drain #2 since the patient already has a drain in the transgluteal region. Attention was directed to an additional fluid collection more caudal in the right lower quadrant. This area was anesthetized with 1% lidocaine  and a small incision was made. Using ultrasound guidance, an 18 gauge needle was directed into this collection. Superstiff Amplatz wire was placed. Wire placement was confirmed with ultrasound and CT. Tract was dilated to accommodate another 10 French multipurpose drain. However, no significant fluid could be aspirated from the drain and ultrasound demonstrated that the drain was not in the collection. Drain was completely removed. The right lower quadrant fluid collection was punctured again using ultrasound guidance. Wire placement was again confirmed with ultrasound guidance. The tract was dilated to accommodate a 10 French  multipurpose drain. Follow up CT images demonstrated that the drain advanced through the collection. Drain was pulled back using CT and ultrasound guidance. Approximately 10 mL of thick bloody fluid was aspirated from the collection. Drain was labeled as drain #3. Both drains were flushed with saline and attached to suction bulbs. Both drains were sutured to the skin. RADIATION DOSE REDUCTION: This exam was performed according to the departmental dose-optimization program which includes automated exposure control, adjustment of the mA and/or kV according to patient size and/or use of iterative reconstruction technique. FINDINGS: CT images demonstrated multiple small peripherally enhancing fluid collections in the lower abdomen. The collection in the periappendiceal area was targeted. Drain (#2) was placed in this collection and 10 mL of thick yellow/brown fluid was aspirated. This collection was decompressed at the end of the procedure. Second fluid collection in the right lower quadrant was targeted. Drain (#3) was placed within this collection and 10 mL of  thick bloody fluid was aspirated. IMPRESSION: 1. Image guided placement of a drain within the periappendiceal abscess, labeled as drain #2. 2. Image guided placement of a drain within a right lower quadrant abscess, labeled as drain #3. Electronically Signed   By: Juliene Balder M.D.   On: 07/18/2024 16:15   CT ABDOMEN PELVIS W CONTRAST Result Date: 07/16/2024 EXAM: CT ABDOMEN AND PELVIS WITH CONTRAST 07/16/2024 11:05:33 AM TECHNIQUE: CT of the abdomen and pelvis was performed with the administration of 100 mL of iohexol  (OMNIPAQUE ) 300 MG/ML solution. Multiplanar reformatted images are provided for review. Automated exposure control, iterative reconstruction, and/or weight-based adjustment of the mA/kV was utilized to reduce the radiation dose to as low as reasonably achievable. COMPARISON: CT 07/11/2024. CLINICAL HISTORY: Intra-abdominal abscess/fluid retention  evaluation; Right lower abdominal pain, perforated appendicitis with intra-abdominal abscesses; status post right pelvic abscess drain via transgluteal approach on 07/12/2024. FINDINGS: LOWER CHEST: No acute abnormality. LIVER: The liver is unremarkable. GALLBLADDER AND BILE DUCTS: Gallbladder is unremarkable. No biliary ductal dilatation. SPLEEN: No acute abnormality. PANCREAS: No acute abnormality. ADRENAL GLANDS: No acute abnormality. KIDNEYS, URETERS AND BLADDER: No stones in the kidneys or ureters. No hydronephrosis. No perinephric or periureteral stranding. Urinary bladder is unremarkable. GI AND BOWEL: Stomach demonstrates no acute abnormality. The appendix is not well identified. There is probable small abscess at the tip of the cecum measuring 4.7 x 2.9 cm. Oral contrast transients the entirety of the small bowel into the ascending colon. No evidence of bowel obstruction or perforation. Some secondary inflammation of the sigmoid colon related to the perforation. PERITONEUM AND RETROPERITONEUM: Interval placement of a transgluteal mucous catheter. There is near complete resolution of the fluid collection previously seen in the posterior cul de sac anterior to the rectum. Several smaller collections remain in the right lower quadrant. Mild enhancement collection along the right iliac vessels measures 3.2 x 2.4 cm, slight increase from 3.0 x 1.8 cm. There is interval decrease in inflammation adjacent to the supraspinatus tip. No free air. VASCULATURE: Aorta is normal in caliber. LYMPH NODES: No lymphadenopathy. REPRODUCTIVE ORGANS: No acute abnormality. BONES AND SOFT TISSUES: No acute osseous abnormality. No focal soft tissue abnormality. IMPRESSION: 1. Improvement in inflammatory / infectious process in the right lobe quadrant (perforated appendicitis) 2. Near complete resolution of the fluid collection previously seen in the posterior cul de sac anterior to the rectum following interval transgluteal drain  placement. 3. Several smaller right lower quadrant collections persist, including a collection along the right iliac vessels measuring 3.2 x 2.4 cm (slightly increased from 3.0 x 1.8 cm) and a probable small abscess at the tip of the cecum measuring 4.7 x 2.9 cm. 4. No evidence of bowel obstruction or perforation. Electronically signed by: Norleen Boxer MD 07/16/2024 03:54 PM EST RP Workstation: [id number]   DG Abd Portable 1V Result Date: 07/13/2024 CLINICAL DATA:  Small bowel obstruction. EXAM: PORTABLE ABDOMEN - 1 VIEW COMPARISON:  Abdominal radiograph dated 07/12/2024. FINDINGS: Partially visualized enteric tube with tip in the proximal stomach and side-port in the region of the GE junction. Recommend further advancing by additional 5 cm. Diffusely dilated small bowel loops measure up to 5 cm in caliber. A pigtail catheter noted over the pelvis. Degenerative changes of the spine. No acute osseous pathology. IMPRESSION: 1. Persistent small bowel obstruction. 2. Recommend further advancing the enteric tube by additional 5 cm. Electronically Signed   By: Vanetta Chou M.D.   On: 07/13/2024 13:40   ECHOCARDIOGRAM COMPLETE Result Date: 07/13/2024  ECHOCARDIOGRAM REPORT   Patient Name:   PIERS BAADE Date of Exam: 07/12/2024 Medical Rec #:  [id number]        Height:       70.0 in Accession #:    [id number]       Weight:       186.5 lb Date of Birth:  Jan 21, 1951        BSA:          2.026 m Patient Age:    73 years         BP:           113/66 mmHg Patient Gender: M                HR:           95 bpm. Exam Location:  Inpatient Procedure: 2D Echo, Cardiac Doppler and Color Doppler (Both Spectral and Color            Flow Doppler were utilized during procedure). Indications:     Abnormal ECG 194.31  History:         Patient has no prior history of Echocardiogram examinations.                  Risk Factors:Hypertension.  Sonographer:     Merlynn Argyle Referring Phys:  [id number] SUMAYYA AMIN Diagnosing Phys: Darryle Decent MD  Sonographer Comments: Image acquisition challenging due to respiratory motion. IMPRESSIONS  1. Left ventricular ejection fraction, by estimation, is 60 to 65%. The left ventricle has normal function. The left ventricle has no regional wall motion abnormalities. Indeterminate diastolic filling due to E-A fusion.  2. Right ventricular systolic function is normal. The right ventricular size is normal. There is normal pulmonary artery systolic pressure. The estimated right ventricular systolic pressure is 22.4 mmHg.  3. The mitral valve is grossly normal. Trivial mitral valve regurgitation. No evidence of mitral stenosis.  4. The aortic valve is tricuspid. Aortic valve regurgitation is mild. No aortic stenosis is present.  5. The inferior vena cava is normal in size with greater than 50% respiratory variability, suggesting right atrial pressure of 3 mmHg. FINDINGS  Left Ventricle: Left ventricular ejection fraction, by estimation, is 60 to 65%. The left ventricle has normal function. The left ventricle has no regional wall motion abnormalities. The left ventricular internal cavity size was normal in size. There is  no left ventricular hypertrophy. Indeterminate diastolic filling due to E-A fusion. Right Ventricle: The right ventricular size is normal. No increase in right ventricular wall thickness. Right ventricular systolic function is normal. There is normal pulmonary artery systolic pressure. The tricuspid regurgitant velocity is 2.20 m/s, and  with an assumed right atrial pressure of 3 mmHg, the estimated right ventricular systolic pressure is 22.4 mmHg. Left Atrium: Left atrial size was normal in size. Right Atrium: Right atrial size was normal in size. Pericardium: Trivial pericardial effusion is present. Mitral Valve: The mitral valve is grossly normal. Trivial mitral valve regurgitation. No evidence of mitral valve stenosis. Tricuspid Valve: The tricuspid valve is grossly normal. Tricuspid valve  regurgitation is trivial. No evidence of tricuspid stenosis. Aortic Valve: The aortic valve is tricuspid. Aortic valve regurgitation is mild. No aortic stenosis is present. Pulmonic Valve: The pulmonic valve was grossly normal. Pulmonic valve regurgitation is trivial. No evidence of pulmonic stenosis. Aorta: The aortic root and ascending aorta are structurally normal, with no evidence of dilitation. Venous: The inferior vena cava is normal in size with greater  than 50% respiratory variability, suggesting right atrial pressure of 3 mmHg. IAS/Shunts: The atrial septum is grossly normal.  LEFT VENTRICLE PLAX 2D LVIDd:         4.00 cm LVIDs:         3.10 cm LV PW:         1.00 cm LV IVS:        1.10 cm  RIGHT VENTRICLE          IVC RV Basal diam:  3.60 cm  IVC diam: 1.00 cm TAPSE (M-mode): 2.6 cm LEFT ATRIUM             Index        RIGHT ATRIUM           Index LA diam:        4.30 cm 2.12 cm/m   RA Area:     14.80 cm LA Vol (A2C):   35.4 ml 17.47 ml/m  RA Volume:   29.80 ml  14.71 ml/m LA Vol (A4C):   33.2 ml 16.38 ml/m LA Biplane Vol: 35.0 ml 17.27 ml/m  AORTIC VALVE             PULMONIC VALVE LVOT Vmax:   64.00 cm/s  PR End Diast Vel: 6.15 msec LVOT Vmean:  46.200 cm/s LVOT VTI:    0.117 m  AORTA Ao Root diam: 3.50 cm Ao Asc diam:  3.80 cm TRICUSPID VALVE TR Peak grad:   19.4 mmHg TR Vmax:        220.00 cm/s  SHUNTS Systemic VTI: 0.12 m Darryle Decent MD Electronically signed by Darryle Decent MD Signature Date/Time: 07/12/2024/2:09:26 PM    Final (Updated)    CT GUIDED PERITONEAL/RETROPERITONEAL FLUID DRAIN BY PERC CATH Result Date: 07/12/2024 INDICATION: 74 year old male with history of perforated appendicitis complicated by pelvic abscess formation. EXAM: CT PERC DRAIN PERITONEAL ABCESS COMPARISON:  07/11/2024 MEDICATIONS: The patient is currently admitted to the hospital and receiving intravenous antibiotics. The antibiotics were administered within an appropriate time frame prior to the initiation of the  procedure. ANESTHESIA/SEDATION: Moderate (conscious) sedation was employed during this procedure. A total of Versed  1 mg and Fentanyl  50 mcg was administered intravenously. Moderate Sedation Time: 13 minutes. The patient's level of consciousness and vital signs were monitored continuously by radiology nursing throughout the procedure under my direct supervision. CONTRAST:  None COMPLICATIONS: None immediate. PROCEDURE: RADIATION DOSE REDUCTION: This exam was performed according to the departmental dose-optimization program which includes automated exposure control, adjustment of the mA and/or kV according to patient size and/or use of iterative reconstruction technique. Informed written consent was obtained from the patient after a discussion of the risks, benefits and alternatives to treatment. The patient was placed prone on the CT gantry and a pre procedural CT was performed re-demonstrating the known abscess/fluid collection within the pelvis. The procedure was planned. A timeout was performed prior to the initiation of the procedure. The right trans gluteal region was prepped and draped in the usual sterile fashion. The overlying soft tissues were anesthetized with 1% lidocaine  with epinephrine. Appropriate trajectory was planned with the use of a 22 gauge spinal needle. An 18 gauge trocar needle was advanced into the abscess/fluid collection and a short Amplatz super stiff wire was coiled within the collection. Appropriate positioning was confirmed with a limited CT scan. The tract was serially dilated allowing placement of a 10 French all-purpose drainage catheter. Appropriate positioning was confirmed with a limited postprocedural CT scan. Approximately 100 ml of purulent  fluid was aspirated. The tube was connected to a bulb suction and sutured in place. A dressing was placed. The patient tolerated the procedure well without immediate post procedural complication. IMPRESSION: Successful CT guided placement  of a right transgluteal 10 French all purpose drain catheter into the pelvic abscess with aspiration of 100 mL of purulent fluid. Samples were sent to the laboratory as requested by the ordering clinical team. Ester Sides, MD [facility] Radiology Electronically Signed   By: Ester Sides M.D.   On: 07/12/2024 15:23   DG Abd Portable 1V Result Date: 07/12/2024 CLINICAL DATA:  Nasogastric tube placement. EXAM: PORTABLE ABDOMEN - 1 VIEW COMPARISON:  Yesterday FINDINGS: Nasogastric tube tip is seen in proximal stomach. Small bowel dilatation is noted concerning for distal small bowel obstruction. IMPRESSION: Nasogastric tube tip seen in proximal stomach. Small bowel dilatation is noted concerning for distal small bowel obstruction. Electronically Signed   By: Lynwood Landy Raddle M.D.   On: 07/12/2024 12:46   CT ABDOMEN PELVIS W CONTRAST Result Date: 07/11/2024 CLINICAL DATA:  Right lower quadrant abdominal pain. EXAM: CT ABDOMEN AND PELVIS WITH CONTRAST TECHNIQUE: Multidetector CT imaging of the abdomen and pelvis was performed using the standard protocol following bolus administration of intravenous contrast. RADIATION DOSE REDUCTION: This exam was performed according to the departmental dose-optimization program which includes automated exposure control, adjustment of the mA and/or kV according to patient size and/or use of iterative reconstruction technique. CONTRAST:  OMNIPAQUE  IOHEXOL  300 MG/ML  SOLN COMPARISON:  None Available. FINDINGS: Lower chest: The lung bases demonstrate streaky bibasilar atelectasis or scarring changes. No infiltrates or effusions. No pericardial effusion. Hepatobiliary: No focal liver abnormality is seen. No gallstones, gallbladder wall thickening, or biliary dilatation. Pancreas: Unremarkable. No pancreatic ductal dilatation or surrounding inflammatory changes. Spleen: Normal in size without focal abnormality. Adrenals/Urinary  Tract: The adrenal glands and kidneys are normal. Benign bilateral parapelvic renal cysts not requiring any further imaging evaluation or follow-up. The bladder is unremarkable. Stomach/Bowel: The stomach is unremarkable. The duodenum is normal. Dilated proximal and mid small bowel loops with air-fluid levels. Transition to decompressed and inflamed appearing mid distal ileal loops of small bowel. The colon is unremarkable. No colonic lesions. Advanced sigmoid colon diverticulosis. Extensive inflammatory process in the right lower quadrant and upper right pelvis. CT findings consistent with perforated appendicitis with abdominal and pelvic abscesses. The largest right lower quadrant abscess measures 6.4 x 3.4 cm and contains a small amount non dependent gas. The pelvic abscess is between the bladder and the rectum and measures approximately 7.3 x 5.0 cm. Multiple other smaller likely communicating abscesses in the right lower quadrant and pelvis. Vascular/Lymphatic: Atherosclerotic calcifications involving the aorta and iliac arteries but no aneurysm. The branch vessels are patent. The major venous structures are patent. Small mesenteric and retroperitoneal lymph nodes, likely reactive. No pelvic adenopathy. Reproductive: The prostate gland and seminal vesicles are unremarkable. Bilateral hydroceles are noted. Other: No free air is identified but small locules gas are noted in the abscesses. Musculoskeletal: No significant bony findings. Moderate degenerative changes involving the spine. IMPRESSION: 1. CT findings consistent with perforated appendicitis with abdominal and pelvic abscesses. Recommend surgery consultation. 2. Associated small bowel obstruction (likely functional as opposed to mechanical). Inflamed enhancing right lower quadrant 3. Advanced sigmoid colon diverticulosis. 4. Aortic atherosclerosis. Aortic Atherosclerosis (ICD10-I70.0). Electronically Signed   By: MYRTIS Stammer M.D.   On: 07/11/2024 17:56    Labs:   Basic Metabolic Panel: Recent  Labs  Lab 07/13/24 0029 07/14/24 0419 07/14/24 1427 07/19/24 0740  NA 138 142 143 136  K 3.3* 3.9 3.8 4.1  CL 102 107 107 103  CO2 23 22 23 25   GLUCOSE 100* 93 89 119*  BUN 34* 27* 24* 6*  CREATININE 1.22 1.12 1.03 0.98  CALCIUM 9.2 9.1 8.8* 8.8*     CBC: Recent Labs  Lab 07/15/24 0404 07/16/24 0356 07/17/24 0408 07/18/24 0351 07/19/24 0740  WBC 10.4 11.0* 11.7* 11.5* 9.5  HGB 11.8* 12.8* 12.9* 12.7* 13.2  HCT 36.2* 38.7* 38.9* 38.1* 39.3  MCV 99.7 98.7 97.7 97.4 98.5  PLT 315 295 289 302 331         SIGNED:   True Atlas, MD  [facility] 07/19/2024, 2:06 PM Time taking on discharge: 50 minutes

## 2024-07-20 ENCOUNTER — Other Ambulatory Visit: Payer: Self-pay | Admitting: Surgery

## 2024-07-20 DIAGNOSIS — K3533 Acute appendicitis with perforation and localized peritonitis, with abscess: Secondary | ICD-10-CM

## 2024-07-21 LAB — AEROBIC/ANAEROBIC CULTURE W GRAM STAIN (SURGICAL/DEEP WOUND)

## 2024-07-22 ENCOUNTER — Other Ambulatory Visit

## 2024-07-22 ENCOUNTER — Other Ambulatory Visit: Payer: Self-pay

## 2024-07-22 ENCOUNTER — Observation Stay (HOSPITAL_COMMUNITY)

## 2024-07-22 ENCOUNTER — Encounter (HOSPITAL_COMMUNITY): Payer: Self-pay | Admitting: Internal Medicine

## 2024-07-22 ENCOUNTER — Observation Stay (HOSPITAL_COMMUNITY)
Admission: EM | Admit: 2024-07-22 | Discharge: 2024-07-23 | Disposition: A | Attending: Emergency Medicine | Admitting: Emergency Medicine

## 2024-07-22 DIAGNOSIS — I480 Paroxysmal atrial fibrillation: Secondary | ICD-10-CM | POA: Diagnosis present

## 2024-07-22 DIAGNOSIS — I10 Essential (primary) hypertension: Secondary | ICD-10-CM | POA: Diagnosis not present

## 2024-07-22 DIAGNOSIS — F1090 Alcohol use, unspecified, uncomplicated: Secondary | ICD-10-CM | POA: Diagnosis not present

## 2024-07-22 DIAGNOSIS — R7401 Elevation of levels of liver transaminase levels: Secondary | ICD-10-CM | POA: Diagnosis present

## 2024-07-22 DIAGNOSIS — R55 Syncope and collapse: Secondary | ICD-10-CM | POA: Diagnosis present

## 2024-07-22 DIAGNOSIS — Z79899 Other long term (current) drug therapy: Secondary | ICD-10-CM | POA: Insufficient documentation

## 2024-07-22 DIAGNOSIS — Z9049 Acquired absence of other specified parts of digestive tract: Secondary | ICD-10-CM | POA: Diagnosis not present

## 2024-07-22 DIAGNOSIS — E441 Mild protein-calorie malnutrition: Secondary | ICD-10-CM | POA: Diagnosis not present

## 2024-07-22 DIAGNOSIS — K3533 Acute appendicitis with perforation and localized peritonitis, with abscess: Secondary | ICD-10-CM | POA: Diagnosis not present

## 2024-07-22 DIAGNOSIS — I4891 Unspecified atrial fibrillation: Secondary | ICD-10-CM

## 2024-07-22 LAB — COMPREHENSIVE METABOLIC PANEL WITH GFR
ALT: 41 U/L (ref 0–44)
AST: 43 U/L — ABNORMAL HIGH (ref 15–41)
Albumin: 3.4 g/dL — ABNORMAL LOW (ref 3.5–5.0)
Alkaline Phosphatase: 51 U/L (ref 38–126)
Anion gap: 9 (ref 5–15)
BUN: 7 mg/dL — ABNORMAL LOW (ref 8–23)
CO2: 27 mmol/L (ref 22–32)
Calcium: 9.5 mg/dL (ref 8.9–10.3)
Chloride: 102 mmol/L (ref 98–111)
Creatinine, Ser: 0.94 mg/dL (ref 0.61–1.24)
GFR, Estimated: >60 mL/min
Glucose, Bld: 122 mg/dL — ABNORMAL HIGH (ref 70–99)
Potassium: 3.8 mmol/L (ref 3.5–5.1)
Sodium: 138 mmol/L (ref 135–145)
Total Bilirubin: 0.5 mg/dL (ref 0.0–1.2)
Total Protein: 6.2 g/dL — ABNORMAL LOW (ref 6.5–8.1)

## 2024-07-22 LAB — PHOSPHORUS: Phosphorus: 2.4 mg/dL — ABNORMAL LOW (ref 2.5–4.6)

## 2024-07-22 LAB — MAGNESIUM: Magnesium: 1.8 mg/dL (ref 1.7–2.4)

## 2024-07-22 LAB — CBC WITH DIFFERENTIAL/PLATELET
Abs Immature Granulocytes: 0.08 K/uL — ABNORMAL HIGH (ref 0.00–0.07)
Basophils Absolute: 0 K/uL (ref 0.0–0.1)
Basophils Relative: 0 %
Eosinophils Absolute: 0.1 K/uL (ref 0.0–0.5)
Eosinophils Relative: 1 %
HCT: 40.1 % (ref 39.0–52.0)
Hemoglobin: 13.3 g/dL (ref 13.0–17.0)
Immature Granulocytes: 1 %
Lymphocytes Relative: 11 %
Lymphs Abs: 1 K/uL (ref 0.7–4.0)
MCH: 32.6 pg (ref 26.0–34.0)
MCHC: 33.2 g/dL (ref 30.0–36.0)
MCV: 98.3 fL (ref 80.0–100.0)
Monocytes Absolute: 0.7 K/uL (ref 0.1–1.0)
Monocytes Relative: 7 %
Neutro Abs: 7.7 K/uL (ref 1.7–7.7)
Neutrophils Relative %: 80 %
Platelets: 378 K/uL (ref 150–400)
RBC: 4.08 MIL/uL — ABNORMAL LOW (ref 4.22–5.81)
RDW: 12.1 % (ref 11.5–15.5)
WBC: 9.7 K/uL (ref 4.0–10.5)
nRBC: 0 % (ref 0.0–0.2)

## 2024-07-22 LAB — I-STAT CG4 LACTIC ACID, ED: Lactic Acid, Venous: 1.9 mmol/L (ref 0.5–1.9)

## 2024-07-22 MED ORDER — SODIUM CHLORIDE 0.9% FLUSH
3.0000 mL | Freq: Two times a day (BID) | INTRAVENOUS | Status: DC
  Administered 2024-07-22 – 2024-07-23 (×2): 3 mL via INTRAVENOUS

## 2024-07-22 MED ORDER — METOPROLOL TARTRATE 5 MG/5ML IV SOLN: 5.0000 mg | INTRAVENOUS | Status: DC | PRN

## 2024-07-22 MED ORDER — TAMSULOSIN HCL 0.4 MG PO CAPS
0.4000 mg | ORAL_CAPSULE | ORAL | Status: DC
  Administered 2024-07-23: 0.4 mg via ORAL
  Filled 2024-07-22: qty 1

## 2024-07-22 MED ORDER — MELATONIN 5 MG PO TABS
20.0000 mg | ORAL_TABLET | Freq: Every day | ORAL | Status: DC
  Administered 2024-07-22: 20 mg via ORAL
  Filled 2024-07-22: qty 4

## 2024-07-22 MED ORDER — MONTELUKAST SODIUM 10 MG PO TABS
10.0000 mg | ORAL_TABLET | Freq: Every day | ORAL | Status: DC
  Administered 2024-07-23: 10 mg via ORAL
  Filled 2024-07-22: qty 1

## 2024-07-22 MED ORDER — APIXABAN 5 MG PO TABS
5.0000 mg | ORAL_TABLET | Freq: Two times a day (BID) | ORAL | Status: DC
  Administered 2024-07-22 – 2024-07-23 (×2): 5 mg via ORAL
  Filled 2024-07-22 (×2): qty 1

## 2024-07-22 MED ORDER — ACETAMINOPHEN 650 MG RE SUPP: 650.0000 mg | Freq: Four times a day (QID) | RECTAL | Status: DC | PRN

## 2024-07-22 MED ORDER — K PHOS MONO-SOD PHOS DI & MONO 155-852-130 MG PO TABS
500.0000 mg | ORAL_TABLET | Freq: Every day | ORAL | Status: AC
  Administered 2024-07-22: 500 mg via ORAL
  Filled 2024-07-22: qty 2

## 2024-07-22 MED ORDER — SIMVASTATIN 20 MG PO TABS
20.0000 mg | ORAL_TABLET | Freq: Every day | ORAL | Status: DC
  Administered 2024-07-23: 20 mg via ORAL
  Filled 2024-07-22: qty 1

## 2024-07-22 MED ORDER — METOPROLOL SUCCINATE ER 50 MG PO TB24
50.0000 mg | ORAL_TABLET | Freq: Every day | ORAL | Status: DC
  Administered 2024-07-22: 50 mg via ORAL
  Filled 2024-07-22: qty 1

## 2024-07-22 MED ORDER — ACETAMINOPHEN 325 MG PO TABS: 650.0000 mg | ORAL_TABLET | Freq: Four times a day (QID) | ORAL | Status: DC | PRN

## 2024-07-22 MED ORDER — POTASSIUM CHLORIDE CRYS ER 20 MEQ PO TBCR
40.0000 meq | EXTENDED_RELEASE_TABLET | Freq: Once | ORAL | Status: AC
  Administered 2024-07-22: 40 meq via ORAL
  Filled 2024-07-22: qty 2

## 2024-07-22 MED ORDER — DILTIAZEM HCL 25 MG/5ML IV SOLN
10.0000 mg | Freq: Once | INTRAVENOUS | Status: AC
  Administered 2024-07-22: 10 mg via INTRAVENOUS
  Filled 2024-07-22: qty 5

## 2024-07-22 MED ORDER — DULOXETINE HCL 30 MG PO CPEP
30.0000 mg | ORAL_CAPSULE | Freq: Two times a day (BID) | ORAL | Status: DC
  Administered 2024-07-22 – 2024-07-23 (×2): 30 mg via ORAL
  Filled 2024-07-22 (×2): qty 1

## 2024-07-22 MED ORDER — CIPROFLOXACIN HCL 500 MG PO TABS
500.0000 mg | ORAL_TABLET | Freq: Two times a day (BID) | ORAL | Status: DC
  Administered 2024-07-22 – 2024-07-23 (×3): 500 mg via ORAL
  Filled 2024-07-22 (×3): qty 1

## 2024-07-22 MED ORDER — METRONIDAZOLE 500 MG PO TABS
500.0000 mg | ORAL_TABLET | Freq: Three times a day (TID) | ORAL | Status: DC
  Administered 2024-07-22: 500 mg via ORAL
  Filled 2024-07-22: qty 1

## 2024-07-22 MED ORDER — ONDANSETRON HCL 4 MG PO TABS: 4.0000 mg | ORAL_TABLET | Freq: Four times a day (QID) | ORAL | Status: DC | PRN

## 2024-07-22 MED ORDER — METRONIDAZOLE 500 MG PO TABS
500.0000 mg | ORAL_TABLET | Freq: Two times a day (BID) | ORAL | Status: DC
  Administered 2024-07-23: 500 mg via ORAL
  Filled 2024-07-22: qty 1

## 2024-07-22 MED ORDER — VITAMIN B-12 100 MCG PO TABS
500.0000 ug | ORAL_TABLET | Freq: Every day | ORAL | Status: DC
  Administered 2024-07-23: 500 ug via ORAL
  Filled 2024-07-22: qty 5

## 2024-07-22 MED ORDER — ONDANSETRON HCL 4 MG/2ML IJ SOLN: 4.0000 mg | Freq: Four times a day (QID) | INTRAMUSCULAR | Status: DC | PRN

## 2024-07-22 MED ORDER — DILTIAZEM HCL 25 MG/5ML IV SOLN: 10.0000 mg | INTRAVENOUS | Status: DC | PRN

## 2024-07-22 MED ORDER — MAGNESIUM SULFATE 2 GM/50ML IV SOLN
2.0000 g | Freq: Once | INTRAVENOUS | Status: AC
  Administered 2024-07-22: 2 g via INTRAVENOUS
  Filled 2024-07-22: qty 50

## 2024-07-22 NOTE — Plan of Care (Signed)
  Problem: Education: Goal: Knowledge of condition and prescribed therapy will improve Outcome: Progressing   Problem: Cardiac: Goal: Will achieve and/or maintain adequate cardiac output Outcome: Progressing   Problem: Physical Regulation: Goal: Complications related to the disease process, condition or treatment will be avoided or minimized Outcome: Progressing   Problem: Education: Goal: Knowledge of General Education information will improve Description: Including pain rating scale, medication(s)/side effects and non-pharmacologic comfort measures Outcome: Progressing   Problem: Health Behavior/Discharge Planning: Goal: Ability to manage health-related needs will improve Outcome: Progressing   Problem: Clinical Measurements: Goal: Ability to maintain clinical measurements within normal limits will improve Outcome: Progressing Goal: Will remain free from infection Outcome: Progressing Goal: Diagnostic test results will improve Outcome: Progressing Goal: Respiratory complications will improve Outcome: Progressing Goal: Cardiovascular complication will be avoided Outcome: Progressing   Problem: Activity: Goal: Risk for activity intolerance will decrease Outcome: Progressing   Problem: Nutrition: Goal: Adequate nutrition will be maintained Outcome: Progressing   Problem: Coping: Goal: Level of anxiety will decrease Outcome: Progressing   Problem: Elimination: Goal: Will not experience complications related to bowel motility Outcome: Progressing Goal: Will not experience complications related to urinary retention Outcome: Progressing   Problem: Pain Managment: Goal: General experience of comfort will improve and/or be controlled Outcome: Progressing   Problem: Safety: Goal: Ability to remain free from injury will improve Outcome: Progressing   Problem: Skin Integrity: Goal: Risk for impaired skin integrity will decrease Outcome: Progressing   Problem:  Education: Goal: Knowledge of disease or condition will improve Outcome: Progressing Goal: Understanding of medication regimen will improve Outcome: Progressing Goal: Individualized Educational Video(s) Outcome: Progressing   Problem: Activity: Goal: Ability to tolerate increased activity will improve Outcome: Progressing   Problem: Cardiac: Goal: Ability to achieve and maintain adequate cardiopulmonary perfusion will improve Outcome: Progressing   Problem: Health Behavior/Discharge Planning: Goal: Ability to safely manage health-related needs after discharge will improve Outcome: Progressing

## 2024-07-22 NOTE — ED Triage Notes (Signed)
 Patient BIBA coming from home, per wife patient had syncopal episode, denies hitting head. Patient is alert and oriented x 4. Airway patent, respirations even and unlabored. Skin normal, warm and dry. Admitted recently for ruptured appendix, currently has 4 drains placed, afib RVR recent dx, takling xarelto. Orthostatic changed en route. BP laying 144/82 then sitting 100/60, HR 110 98% RA 20g left AC 700 LR en route.

## 2024-07-22 NOTE — Progress Notes (Shared)
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 supervision. CONTRAST:  None COMPLICATIONS: None immediate. PROCEDURE: RADIATION DOSE REDUCTION: This exam was performed according to the departmental dose-optimization program which includes automated exposure control, adjustment of the mA and/or kV according to patient size and/or use of iterative reconstruction technique. Informed written consent was obtained from the patient after a discussion of the risks, benefits and alternatives to treatment. The patient was placed prone on the CT gantry and a pre  procedural CT was performed re-demonstrating the known abscess/fluid collection within the pelvis. The procedure was planned. A timeout was performed prior to the initiation of the procedure. The right trans gluteal region was prepped and draped in the usual sterile fashion. The overlying soft tissues were anesthetized with 1% lidocaine  with epinephrine. Appropriate trajectory was planned with the use of a 22 gauge spinal needle. An 18 gauge trocar needle was advanced into the abscess/fluid collection and a short Amplatz super stiff wire was coiled within the collection. Appropriate positioning was confirmed with a limited CT scan. The tract was serially  dilated allowing placement of a 10 French all-purpose drainage catheter. Appropriate positioning was confirmed with a limited postprocedural CT scan. Approximately 100 ml of purulent fluid was aspirated. The tube was connected to a bulb suction and sutured in place. A dressing was placed. The patient tolerated the procedure well without immediate post procedural complication. IMPRESSION: Successful CT guided placement of a right transgluteal 10 French all purpose drain catheter into the pelvic abscess with aspiration of 100 mL of purulent fluid. Samples were sent to the laboratory as requested by the ordering clinical team. Ester Sides, MD [facility] Radiology Electronically Signed   By: Ester Sides M.D.   On: 07/12/2024 15:23   DG Abd Portable 1V Result Date: 07/12/2024 CLINICAL DATA:  Nasogastric tube placement. EXAM: PORTABLE ABDOMEN - 1 VIEW COMPARISON:  Yesterday FINDINGS: Nasogastric tube tip is seen in proximal stomach. Small bowel dilatation is noted concerning for distal small bowel obstruction. IMPRESSION: Nasogastric tube tip seen in proximal stomach. Small bowel dilatation is noted concerning for distal small bowel obstruction. Electronically Signed   By: Lynwood Landy Raddle M.D.   On: 07/12/2024 12:46   CT ABDOMEN PELVIS W CONTRAST Result Date: 07/11/2024 CLINICAL DATA:  Right lower quadrant abdominal pain. EXAM: CT ABDOMEN AND PELVIS WITH CONTRAST TECHNIQUE: Multidetector CT imaging of the abdomen and pelvis was performed using the standard protocol following bolus administration of intravenous contrast. RADIATION DOSE REDUCTION: This exam was performed according to the departmental dose-optimization program which includes automated exposure control, adjustment of the mA and/or kV according to patient size and/or use of iterative reconstruction technique. CONTRAST:  OMNIPAQUE  IOHEXOL  300 MG/ML  SOLN COMPARISON:  None Available. FINDINGS:  Lower chest: The lung bases demonstrate streaky bibasilar atelectasis or scarring changes. No infiltrates or effusions. No pericardial effusion. Hepatobiliary: No focal liver abnormality is seen. No gallstones, gallbladder wall thickening, or biliary dilatation. Pancreas: Unremarkable. No pancreatic ductal dilatation or surrounding inflammatory changes. Spleen: Normal in size without focal abnormality. Adrenals/Urinary Tract: The adrenal glands and kidneys are normal. Benign bilateral parapelvic renal cysts not requiring any further imaging evaluation or follow-up. The bladder is unremarkable. Stomach/Bowel: The stomach is unremarkable. The duodenum is normal. Dilated proximal and mid small bowel loops with air-fluid levels. Transition to decompressed and inflamed appearing mid distal ileal loops of small bowel. The colon is unremarkable. No colonic lesions. Advanced sigmoid colon diverticulosis. Extensive inflammatory process in the right lower quadrant and upper right pelvis. CT findings consistent with perforated appendicitis with abdominal and pelvic abscesses. The largest right lower quadrant abscess measures 6.4 x 3.4 cm and contains a small amount non dependent gas. The pelvic abscess is between the bladder and the rectum and measures approximately 7.3 x 5.0 cm. Multiple other smaller likely communicating abscesses in the right lower quadrant and pelvis. Vascular/Lymphatic: Atherosclerotic calcifications involving the aorta and iliac arteries but no aneurysm. The branch  vessels are patent. The major venous structures are patent. Small mesenteric and retroperitoneal lymph nodes, likely reactive. No pelvic adenopathy. Reproductive: The prostate gland and seminal vesicles are unremarkable. Bilateral hydroceles are noted. Other: No free air is identified but small locules gas are noted in the abscesses. Musculoskeletal: No significant bony findings. Moderate degenerative changes involving the spine. IMPRESSION:  1. CT findings consistent with perforated appendicitis with abdominal and pelvic abscesses. Recommend surgery consultation. 2. Associated small bowel obstruction (likely functional as opposed to mechanical). Inflamed enhancing right lower quadrant 3. Advanced sigmoid colon diverticulosis. 4. Aortic atherosclerosis. Aortic Atherosclerosis (ICD10-I70.0). Electronically Signed   By: MYRTIS Stammer M.D.   On: 07/11/2024 17:56    Labs:  CBC: Recent Labs    07/16/24 0356 07/17/24 0408 07/18/24 0351 07/19/24 0740  WBC 11.0* 11.7* 11.5* 9.5  HGB 12.8* 12.9* 12.7* 13.2  HCT 38.7* 38.9* 38.1* 39.3  PLT 295 289 302 331    COAGS: Recent Labs    07/12/24 1130 07/17/24 2048 07/18/24 2219 07/19/24 0740  INR 1.1  --   --   --   APTT  --  96* 118* 109*    BMP: Recent Labs    07/13/24 0029 07/14/24 0419 07/14/24 1427 07/19/24 0740  NA 138 142 143 136  K 3.3* 3.9 3.8 4.1  CL 102 107 107 103  CO2 23 22 23 25   GLUCOSE 100* 93 89 119*  BUN 34* 27* 24* 6*  CALCIUM 9.2 9.1 8.8* 8.8*  CREATININE 1.22 1.12 1.03 0.98  GFRNONAA >60 >60 >60 >60    LIVER FUNCTION TESTS: Recent Labs    07/13/24 0029 07/14/24 0419 07/14/24 1427 07/19/24 0740  BILITOT 1.4* 1.1 1.1 0.4  AST 49* 31 31 29   ALT 92* 60* 55* 35  ALKPHOS 85 74 79 54  PROT 6.6 6.4* 6.1* 5.8*  ALBUMIN 3.2* 3.2* 3.1* 3.1*    TUMOR MARKERS: No results for input(s): AFPTM, CEA, CA199, CHROMGRNA in the last 8760 hours.  Assessment and Plan:  ***  Thank you for this interesting consult.  I greatly enjoyed meeting Luke Dalton and look forward to participating in their care.  A copy of this report was sent to the requesting provider on this date.  Electronically Signed: Reilynn Lauro 07/22/2024, 9:43 AM   I spent a total of  {Established Out-Pt:[id number]} in face to face in clinical consultation, greater than 50% of which was counseling/coordinating care for s/p x3 intra-abd abscess drains.   "
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
## 2024-07-22 NOTE — ED Provider Notes (Signed)
 " [place] EMERGENCY DEPARTMENT AT [facility] Provider Note   CSN: [id number] Arrival date & time: 07/22/24  1012     Patient presents with: Loss of Consciousness   Luke Dalton is a 74 y.o. male.    Loss of Consciousness    Patient has a history of hypercholesterolemia acid reflux hypertension.  Patient was also recently admitted to the hospital on January 12 for acute appendicitis with perforation.  Patient was treated with antibiotics and had drains placed.  He has not had an appendectomy.  Patient states while he was in the hospital he developed atrial fibrillation.  Patient presents to the ED for a syncopal episode.  Patient states he was getting up this morning because he had an appointment to follow-up with the surgeons office.  He started to feel lightheaded.  Patient states the next thing he remembers is being on the ground.  Patient states his wife caught him and he did not hit his head.  He is not having any pain.  He denies any chest pain or shortness of breath.  Right now he states he is feeling fine  Prior to Admission medications  Medication Sig Start Date End Date Taking? Authorizing Provider  amoxicillin -clavulanate (AUGMENTIN ) 875-125 MG tablet Take 1 tablet by mouth 2 (two) times daily. 07/17/24   Sheldon Standing, MD  apixaban  (ELIQUIS ) 5 MG TABS tablet Take 1 tablet (5 mg total) by mouth 2 (two) times daily. 07/19/24   Rizwan, Saima, MD  Ascorbic Acid (VITAMIN C) 1000 MG tablet Take 1,000 mg by mouth daily.    [provider]  Cholecalciferol (VITAMIN D3) 50 MCG (2000 UT) TABS Take 1 tablet by mouth daily.    [provider]  ciprofloxacin  (CIPRO ) 500 MG tablet Take 1 tablet (500 mg total) by mouth 2 (two) times daily for 5 days. 07/19/24 07/24/24  Rizwan, Saima, MD  cyanocobalamin  (VITAMIN B12) 500 MCG tablet Take 500 mcg by mouth daily.    [provider]  DULoxetine  (CYMBALTA ) 30 MG capsule Take 1 capsule (30 mg total) by mouth 2  (two) times daily. 07/06/24   Patel, Donika K, DO  famotidine (PEPCID) 40 MG tablet Take 40 mg by mouth as needed for heartburn.    [provider]  metoprolol  succinate (TOPROL  XL) 50 MG 24 hr tablet Take 1 tablet (50 mg total) by mouth daily. Take with or immediately following a meal. 07/19/24 07/19/25  Earley Saucer, MD  metroNIDAZOLE  (FLAGYL ) 500 MG tablet Take 1 tablet (500 mg total) by mouth 3 (three) times daily for 5 days. 07/19/24 07/24/24  Rizwan, Saima, MD  montelukast  (SINGULAIR ) 10 MG tablet Take 10 mg by mouth every morning.    [provider]  Omega-3 Fatty Acids (FISH OIL PO) Take 1 capsule by mouth daily.    [provider]  simvastatin  (ZOCOR ) 40 MG tablet Take 40 mg by mouth every other day. Patient taking differently: Take 20 mg by mouth daily.    [provider]  tamsulosin  (FLOMAX ) 0.4 MG CAPS capsule Take 0.4 mg by mouth every other day.    [provider]    Allergies: Losartan    Review of Systems  Cardiovascular:  Positive for syncope.    Updated Vital Signs BP (!) 130/118   Pulse (!) 103   Temp (!) 97.5 F (36.4 C) (Oral)   Resp 13   SpO2 100%   Physical Exam Vitals and nursing note reviewed.  Constitutional:  Appearance: He is well-developed. He is not diaphoretic.  HENT:     Head: Normocephalic and atraumatic.     Right Ear: External ear normal.     Left Ear: External ear normal.  Eyes:     General: No scleral icterus.       Right eye: No discharge.        Left eye: No discharge.     Conjunctiva/sclera: Conjunctivae normal.  Neck:     Trachea: No tracheal deviation.  Cardiovascular:     Rate and Rhythm: Tachycardia present. Rhythm irregular.  Pulmonary:     Effort: Pulmonary effort is normal. No respiratory distress.     Breath sounds: Normal breath sounds. No stridor. No wheezing or rales.  Abdominal:     General: Bowel sounds are normal. There is no distension.     Palpations: Abdomen is soft.      Tenderness: There is no abdominal tenderness. There is no guarding or rebound.     Comments: Drains in place  Musculoskeletal:        General: No tenderness or deformity.     Cervical back: Neck supple.  Skin:    General: Skin is warm and dry.     Findings: No rash.  Neurological:     General: No focal deficit present.     Mental Status: He is alert.     Cranial Nerves: No cranial nerve deficit, dysarthria or facial asymmetry.     Sensory: No sensory deficit.     Motor: No abnormal muscle tone or seizure activity.     Coordination: Coordination normal.  Psychiatric:        Mood and Affect: Mood normal.     (all labs ordered are listed, but only abnormal results are displayed) Labs Reviewed  CBC WITH DIFFERENTIAL/PLATELET - Abnormal; Notable for the following components:      Result Value   RBC 4.08 (*)    Abs Immature Granulocytes 0.08 (*)    All other components within normal limits  COMPREHENSIVE METABOLIC PANEL WITH GFR - Abnormal; Notable for the following components:   Glucose, Bld 122 (*)    BUN 7 (*)    Total Protein 6.2 (*)    Albumin 3.4 (*)    AST 43 (*)    All other components within normal limits  I-STAT CG4 LACTIC ACID, ED    EKG: EKG Interpretation Date/Time:  Friday July 22 2024 10:28:11 EST Ventricular Rate:  120 PR Interval:    QRS Duration:  121 QT Interval:  314 QTC Calculation: 462 R Axis:   -3  Text Interpretation: Atrial fibrillation Nonspecific intraventricular conduction delay Borderline T abnormalities, inferior leads No significant change since last tracing except rate faster Confirmed by Randol Simmonds [phone]) on 07/22/2024 11:24:15 AM  Radiology: No results found.   Procedures   Medications Ordered in the ED  diltiazem (CARDIZEM) injection 10 mg (10 mg Intravenous Given 07/22/24 1135)                                    Medical Decision Making Problems Addressed: Atrial fibrillation with rapid ventricular response [facility]): chronic  illness or injury with exacerbation, progression, or side effects of treatment Syncope and collapse: acute illness or injury that poses a threat to life or bodily functions  Amount and/or Complexity of Data Reviewed Labs: ordered. Decision-making details documented in ED Course.  Risk Prescription drug management. Decision  regarding hospitalization.   Patient presented to the ED for evaluation of a syncopal episode.  Patient had complicated medical history recently with ruptured appendicitis.  He also had episodes of atrial fibrillation.  Patient had prodrome of feeling lightheaded and dizzy when walking.  Patient had a syncopal episode.  His wife helped him to the ground and he denies any injuries.  Labs do not show any signs of severe dehydration.  No signs of anemia.  Lactic acid level is normal.  Patient otherwise feels fine at this time.  Patient was noted to be tachycardic with heart rate in the 120s when he arrived.  He appears to be back in atrial fibrillation with rapid rate.  Patient is on anticoagulation.  Considering his syncopal episode and recurrent A-fib we will bring him in for monitoring.  Case discussed with Dr. Celinda     Final diagnoses:  Syncope and collapse  Atrial fibrillation with rapid ventricular response [facility])    ED Discharge Orders     None          Randol Simmonds, MD 07/22/24 1225  "

## 2024-07-23 ENCOUNTER — Observation Stay (HOSPITAL_COMMUNITY)

## 2024-07-23 ENCOUNTER — Other Ambulatory Visit (HOSPITAL_COMMUNITY): Payer: Self-pay

## 2024-07-23 LAB — COMPREHENSIVE METABOLIC PANEL WITH GFR
ALT: 35 U/L (ref 0–44)
AST: 36 U/L (ref 15–41)
Albumin: 3.1 g/dL — ABNORMAL LOW (ref 3.5–5.0)
Alkaline Phosphatase: 50 U/L (ref 38–126)
Anion gap: 10 (ref 5–15)
BUN: 6 mg/dL — ABNORMAL LOW (ref 8–23)
CO2: 22 mmol/L (ref 22–32)
Calcium: 9.4 mg/dL (ref 8.9–10.3)
Chloride: 103 mmol/L (ref 98–111)
Creatinine, Ser: 0.9 mg/dL (ref 0.61–1.24)
GFR, Estimated: >60 mL/min
Glucose, Bld: 104 mg/dL — ABNORMAL HIGH (ref 70–99)
Potassium: 4.5 mmol/L (ref 3.5–5.1)
Sodium: 135 mmol/L (ref 135–145)
Total Bilirubin: 0.5 mg/dL (ref 0.0–1.2)
Total Protein: 6.1 g/dL — ABNORMAL LOW (ref 6.5–8.1)

## 2024-07-23 LAB — CBC
HCT: 39.6 % (ref 39.0–52.0)
Hemoglobin: 12.8 g/dL — ABNORMAL LOW (ref 13.0–17.0)
MCH: 32.1 pg (ref 26.0–34.0)
MCHC: 32.3 g/dL (ref 30.0–36.0)
MCV: 99.2 fL (ref 80.0–100.0)
Platelets: 404 10*3/uL — ABNORMAL HIGH (ref 150–400)
RBC: 3.99 MIL/uL — ABNORMAL LOW (ref 4.22–5.81)
RDW: 12.2 % (ref 11.5–15.5)
WBC: 8.6 10*3/uL (ref 4.0–10.5)
nRBC: 0 % (ref 0.0–0.2)

## 2024-07-23 LAB — AEROBIC/ANAEROBIC CULTURE W GRAM STAIN (SURGICAL/DEEP WOUND)

## 2024-07-23 MED ORDER — METOPROLOL SUCCINATE ER 25 MG PO TB24
25.0000 mg | ORAL_TABLET | Freq: Every day | ORAL | 0 refills | Status: AC
  Filled 2024-07-23: qty 90, 90d supply, fill #0

## 2024-07-23 MED ORDER — METOPROLOL SUCCINATE ER 25 MG PO TB24
25.0000 mg | ORAL_TABLET | Freq: Every day | ORAL | Status: DC
  Administered 2024-07-23: 25 mg via ORAL
  Filled 2024-07-23: qty 1

## 2024-07-23 NOTE — Progress Notes (Signed)
 Discharge Medications delivered from TOC meds to bed [facility] outpatient pharmacy by this RN.

## 2024-07-23 NOTE — Care Management Obs Status (Signed)
 MEDICARE OBSERVATION STATUS NOTIFICATION   Patient Details  Name: Luke Dalton MRN: [id number] Date of Birth: May 15, 1951   Medicare Observation Status Notification Given:  Yes    Sonda Manuella Quill, RN 07/23/2024, 1:29 PM

## 2024-07-23 NOTE — Consult Note (Signed)
 " Cardiology Consultation:  Patient ID: Luke Dalton MRN: [id number]; DOB: [birth year] Admit date: 07/22/2024 Date of Consult: 07/23/2024 Primary Care Provider: Chrystal Lamarr RAMAN, MD Primary Cardiologist: None  Primary Electrophysiologist:  None   Patient Profile:  Luke Dalton is a 74 y.o. male with a hx of HTN who is being seen today for the evaluation of syncope at the request of Dr. Celinda.  History of Present Illness:  Luke Dalton presents with syncope. He was on his way to the bathroom when he felt lightheaded and passed out. He denies any chest palpitations or dyspnea during that episode. He also denies any tongue bitting or incontinence. In the ED, labs were normal and he received a dose of IV dilt 10.  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 Pertinent positives noted in the HPI.     Physical Exam/Data:   Vitals:   07/22/24 2033 07/23/24 0029 07/23/24 0432 07/23/24 0443  BP: 123/60 129/66  135/61  Pulse: 79 71  69  Resp:  18  18  Temp:  98.3 F (36.8 C)  98.3 F (36.8 C)  TempSrc:  Oral  Oral  SpO2:  97%  97%  Weight:   84 kg   Height:        Intake/Output Summary (Last 24 hours) at 07/23/2024 1121 Last data filed at 07/23/2024 0900 Gross per 24 hour  Intake 480 ml  Output 2175 ml  Net -1695 ml       07/23/2024    4:32 AM 07/22/2024    3:42 PM 07/12/2024    7:21 AM  Last 3 Weights  Weight (lbs) 185 lb 3 oz 186 lb 8.2 oz 186 lb 8.2 oz  Weight (kg) 84 kg 84.6 kg 84.6 kg    Body mass index is 26.57 kg/m.  General: Well nourished, well developed, in no acute distress HEENT: Atraumatic, no JVD Cardiac: Normal S1, S2; RRR; no murmurs, rubs, or gallops Lungs:CTAB, no wheezing, rhonchi or rales  Abd: Soft, nontender, no hepatomegaly  Ext: No edema,  pulses 2+ Skin: Warm and dry, no rashes     Relevant CV Studies Reviewed Assessment and Plan:  Luke Dalton is a 57 yoM with Hx of recent perforated appendic c/b 2 episodes of Afib that spontaneously converted back to NSR who is presented with syncope and Afib with RVR. Patient is now asymptomatic and back in NSR. S/p IV Dilt 10 x 1 in the ED. ECG w/ NSR and 1st degree AVB 310 and recent TTE with EF 60-65%.  #Syncope #Afib with RVR #1st degree AVB - Presenting with syncope that is concerning for cardiac etiology. Unlikely to have been due to Afib given no chest palpitations. I am more concerned for conduction disease from his long PR interval (310). Monitor was ordered during prior admission about a week before, so can follow up with that. - Decrease metop XL to 25 mg - Given no symptoms and no evidence of arrhythmia on tele, patient can d/c and follow up closely outpatient - We will sign off; feel free to call us  back if you have any questions.    For questions or updates, please contact [facility] Please consult [email or web address] for contact info under    Signed, Joelle DEL. Ren Ny, MD, FACC  [place]  [facility]  07/23/2024 11:21 AM     [1]  Allergies Allergen Reactions   Losartan Other (See Comments)     tachycardia, hypotension at 50 mg dose   "

## 2024-07-23 NOTE — Discharge Instructions (Signed)
 SABRA

## 2024-07-23 NOTE — Plan of Care (Signed)
  Problem: Education: Goal: Knowledge of condition and prescribed therapy will improve Outcome: Progressing   Problem: Cardiac: Goal: Will achieve and/or maintain adequate cardiac output Outcome: Progressing   Problem: Physical Regulation: Goal: Complications related to the disease process, condition or treatment will be avoided or minimized Outcome: Progressing   Problem: Education: Goal: Knowledge of General Education information will improve Description: Including pain rating scale, medication(s)/side effects and non-pharmacologic comfort measures Outcome: Progressing   Problem: Health Behavior/Discharge Planning: Goal: Ability to manage health-related needs will improve Outcome: Progressing   Problem: Clinical Measurements: Goal: Ability to maintain clinical measurements within normal limits will improve Outcome: Progressing Goal: Will remain free from infection Outcome: Progressing Goal: Diagnostic test results will improve Outcome: Progressing Goal: Respiratory complications will improve Outcome: Progressing Goal: Cardiovascular complication will be avoided Outcome: Progressing   Problem: Activity: Goal: Risk for activity intolerance will decrease Outcome: Progressing   Problem: Nutrition: Goal: Adequate nutrition will be maintained Outcome: Progressing   Problem: Coping: Goal: Level of anxiety will decrease Outcome: Progressing   Problem: Elimination: Goal: Will not experience complications related to bowel motility Outcome: Progressing Goal: Will not experience complications related to urinary retention Outcome: Progressing   Problem: Pain Managment: Goal: General experience of comfort will improve and/or be controlled Outcome: Progressing   Problem: Safety: Goal: Ability to remain free from injury will improve Outcome: Progressing   Problem: Skin Integrity: Goal: Risk for impaired skin integrity will decrease Outcome: Progressing   Problem:  Education: Goal: Knowledge of disease or condition will improve Outcome: Progressing Goal: Understanding of medication regimen will improve Outcome: Progressing Goal: Individualized Educational Video(s) Outcome: Progressing   Problem: Activity: Goal: Ability to tolerate increased activity will improve Outcome: Progressing   Problem: Cardiac: Goal: Ability to achieve and maintain adequate cardiopulmonary perfusion will improve Outcome: Progressing   Problem: Health Behavior/Discharge Planning: Goal: Ability to safely manage health-related needs after discharge will improve Outcome: Progressing

## 2024-07-23 NOTE — TOC Initial Note (Signed)
 Transition of Care [facility]) - Initial/Assessment Note    Patient Details  Name: Luke Dalton MRN: [id number] Date of Birth: 1950-12-04  Transition of Care [facility]) CM/SW Contact:    Sonda Manuella Quill, RN Phone Number: 07/23/2024, 2:43 PM  Clinical Narrative:                 [place] w/ pt in room; pt said he lives at home w/ his spouse Bazil Dhanani [phone]); he plans to return w/ her support at d/c; family will provide transportation; insurance/PCP verified; he denied SDOH risks; pt has cane; he attends [facility] at [employer]; he does not have home oxygen; no IP CM needs.  Expected Discharge Plan: Home/Self Care Barriers to Discharge: No Barriers Identified   Patient Goals and CMS Choice Patient states their goals for this hospitalization and ongoing recovery are:: home          Expected Discharge Plan and Services   Discharge Planning Services: CM Consult Post Acute Care Choice: Dialysis Living arrangements for the past 2 months: Single Family Home Expected Discharge Date: 07/23/24               DME Arranged: N/A DME Agency: NA       HH Arranged: NA HH Agency: NA        Prior Living Arrangements/Services Living arrangements for the past 2 months: Single Family Home Lives with:: Spouse Patient language and need for interpreter reviewed:: Yes Do you feel safe going back to the place where you live?: Yes      Need for Family Participation in Patient Care: Yes (Comment) Care giver support system in place?: Yes (comment) Current home services: DME (cane) Criminal Activity/Legal Involvement Pertinent to Current Situation/Hospitalization: No - Comment as needed  Activities of Daily Living   ADL Screening (condition at time of admission) Independently performs ADLs?: Yes (appropriate for developmental age) Is the patient deaf or have difficulty hearing?: No Does the patient have difficulty seeing, even when wearing glasses/contacts?: No Does the patient have  difficulty concentrating, remembering, or making decisions?: No  Permission Sought/Granted Permission sought to share information with : Case Manager Permission granted to share information with : Yes, Verbal Permission Granted  Share Information with NAME: Case Manager     Permission granted to share info w Relationship: Travas Schexnayder (spouse) [phone]     Emotional Assessment Appearance:: Appears stated age Attitude/Demeanor/Rapport: Gracious Affect (typically observed): Accepting Orientation: : Oriented to Self, Oriented to Place, Oriented to  Time, Oriented to Situation Alcohol / Substance Use: Not Applicable Psych Involvement: No (comment)  Admission diagnosis:  Syncope and collapse [R55] Atrial fibrillation with rapid ventricular response (HCC) [I48.91] Paroxysmal atrial fibrillation with RVR (HCC) [I48.0] Patient Active Problem List   Diagnosis Date Noted   Paroxysmal atrial fibrillation with RVR (HCC) 07/22/2024   Mild protein malnutrition 07/22/2024   Hypophosphatemia 07/22/2024   Syncope and collapse 07/22/2024   Status post appendectomy 07/22/2024   PAF (paroxysmal atrial fibrillation) (HCC) 07/17/2024   Persistent atrial fibrillation (HCC) 07/12/2024   Preoperative cardiovascular examination 07/12/2024   Acute appendicitis with perforation, localized peritonitis, and abscess 07/11/2024   New onset atrial flutter (HCC) 07/11/2024   AKI (acute kidney injury) 07/11/2024   Transaminitis 07/11/2024   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   PCP:  Chrystal Lamarr RAMAN, MD Pharmacy:  No Pharmacies Listed    Social Drivers of Health (SDOH) Social History: SDOH Screenings   Food Insecurity: No Food Insecurity (07/23/2024)  Housing:  Low Risk (07/23/2024)  Transportation Needs: No Transportation Needs (07/23/2024)  Utilities: Not At Risk (07/23/2024)  Recent Concern: Utilities - At Risk (07/22/2024)  Social Connections: Moderately Integrated  (07/22/2024)  Tobacco Use: Low Risk (07/22/2024)   SDOH Interventions: Food Insecurity Interventions: Intervention Not Indicated, Inpatient TOC Housing Interventions: Intervention Not Indicated, Inpatient TOC Transportation Interventions: Intervention Not Indicated, Inpatient TOC Utilities Interventions: Intervention Not Indicated, Inpatient TOC   Readmission Risk Interventions     No data to display

## 2024-07-23 NOTE — Discharge Summary (Signed)
 "  Physician Discharge Summary  Luke Dalton FMW:[id number] DOB: 03-30-1951 DOA: 07/22/2024  PCP: Chrystal Lamarr RAMAN, MD  Admit date: 07/22/2024 Discharge date: 07/23/24  Admitted From: Home Disposition: Home Recommendations for Outpatient Follow-up:  Outpatient follow-up with PCP in 1 to 2 weeks or sooner if needed Cardiology to arrange outpatient heart monitor and outpatient follow-up.  IR to arrange outpatient follow-up for drain management Check CMP and CBC at follow-up Please follow up on the following pending results: None  Home Health: No need identified Equipment/Devices: No need identified  Discharge Condition: Stable CODE STATUS: Full code   Follow-up Information     Chrystal Lamarr RAMAN, MD. Schedule an appointment as soon as possible for a visit in 1 week(s).   Specialty: Family Medicine Contact information: [address] [place] [address] [phone]                 Hospital course 74 year old M with PMH of paroxysmal A-fib on Eliquis , HTN, HLD, osteoarthritis, GERD and recent hospitalization for perforated appendix treated with drain placement and antibiotics returning to the hospital with syncope and admitted with A-fib with RVR and syncopal episode.  Patient had 2 episodes of paroxysmal A-fib during his last hospitalization for appendicitis but spontaneously converted to normal sinus rhythm.   Patient was standing to wake up his wife so they can go to his outpatient follow-up with IR for drain removal when he felt like rocking side to side on his feet, and his wife got him down to the floor before he sustained a fall.  He thinks he lost consciousness.  He said the next thing he remember was wife talking to EMS over the phone.  Patient denies any prodromes such as chest pain, shortness of breath, nausea or other GI symptoms.  In ED, in A-fib with RVR to 120s but responded to IV Cardizem  push x 1.  He was continued on p.o.  metoprolol .  Initial EKG showed A-fib with RVR.  Repeat EKG showed sinus rhythm with first-degree AV block.  He was evaluated by cardiology, and his Toprol -XL was decreased to 25 mg daily, and cleared for discharge with heart monitor and outpatient follow-up.  Orthostatic vitals were negative.  In regards to his appendicitis with abscess, patient reported no output from the drain over the last 2 days.  Surgery and IR notified, and IR will arrange outpatient follow-up in 2 to 3 days for drain injection and possible repeat CT abdomen and pelvis prior to removing drain.  He is discharged on home Cipro  and Flagyl  to complete antibiotic course as previously planned.  See individual problem list below for more.   Problems addressed during this hospitalization Paroxysmal A-fib with RVR: Converted to sinus rhythm after IV Cardizem  push.  Repeat EKG showed sinus rhythm with first-degree AV block without bradycardia. -Cardiology decreased metoprolol  to 25 mg daily -Outpatient Holter monitor and follow-up -Continue home Eliquis  for anticoagulation -Encouraged to maintain good hydration.  Syncopal episode: Likely due to RVR.  Orthostatic vitals negative. -Management as above.  Perforated appendicitis with multiple abscesses  - Has 3 drains in place.  No output in JP drain bulbs. - Continue Cipro  and Flagyl  as previously planned - IR to arrange outpatient follow-up for drain removal - Outpatient follow-up with surgery as previously planned  Malnutrition ruled out.    Body mass index is 26.57 kg/m.           Consultations: Cardiology Interventional radiology  Time spent 35  minutes  Vital signs Vitals:   07/22/24 2033 07/23/24 0029 07/23/24 0432 07/23/24 0443  BP: 123/60 129/66  135/61  Pulse: 79 71  69  Temp:  98.3 F (36.8 C)  98.3 F (36.8 C)  Resp:  18  18  Height:      Weight:   84 kg   SpO2:  97%  97%  TempSrc:  Oral  Oral  BMI (Calculated):   26.57      Discharge  exam  GENERAL: No apparent distress.  Nontoxic. HEENT: MMM.  Vision and hearing grossly intact.  NECK: Supple.  No apparent JVD.  RESP:  No IWOB.  Fair aeration bilaterally. CVS:  RRR. Heart sounds normal.  ABD/GI/GU: BS+. Abd soft, NTND.  RLQ drain in place.  No output in JP bulb. MSK/EXT:  Moves extremities. No apparent deformity. No edema.  SKIN: no apparent skin lesion or wound NEURO: Awake and alert. Oriented appropriately.  No apparent focal neuro deficit. PSYCH: Calm. Normal affect.   Discharge Instructions Discharge Instructions     Discharge instructions   Complete by: As directed    It has been a pleasure taking care of you!  You were hospitalized after you passed out likely due to atrial fibrillation.  Heart rate improved.  We have made changes to your heart medicine per recommendation by cardiology.  Cardiology will arrange outpatient heart monitor.  Please review your medication list and the directions on your medications before you take them.  Follow-up with your primary care doctor in 1 to 2 weeks or sooner if needed.  Follow-up with cardiology and general surgery per their recommendation.  Maintain good hydration.   Take care,   Increase activity slowly   Complete by: As directed       Allergies as of 07/23/2024       Reactions   Losartan Other (See Comments)    tachycardia, hypotension at 50 mg dose        Medication List     STOP taking these medications    amoxicillin -clavulanate 875-125 MG tablet Commonly known as: AUGMENTIN        TAKE these medications    ciprofloxacin  500 MG tablet Commonly known as: Cipro  Take 1 tablet (500 mg total) by mouth 2 (two) times daily for 5 days. Notes to patient: Take with food   cyanocobalamin  500 MCG tablet Commonly known as: VITAMIN B12 Take 500 mcg by mouth daily.   DULoxetine  30 MG capsule Commonly known as: Cymbalta  Take 1 capsule (30 mg total) by mouth 2 (two) times daily.   Eliquis  5 MG Tabs  tablet Generic drug: apixaban  Take 1 tablet (5 mg total) by mouth 2 (two) times daily.   famotidine 40 MG tablet Commonly known as: PEPCID Take 40 mg by mouth as needed for heartburn.   Melatonin 10 MG Tabs Take 20 mg by mouth at bedtime.   metoprolol  succinate 25 MG 24 hr tablet Commonly known as: TOPROL -XL Take 1 tablet (25 mg total) by mouth daily. What changed:  medication strength how much to take additional instructions   metroNIDAZOLE  500 MG tablet Commonly known as: FLAGYL  Take 1 tablet (500 mg total) by mouth 3 (three) times daily for 5 days. Notes to patient: Take with food 7 am, 3 pm, 9 pm   montelukast  10 MG tablet Commonly known as: SINGULAIR  Take 10 mg by mouth every morning.   simvastatin  40 MG tablet Commonly known as: ZOCOR  Take 40 mg by mouth every other day. What  changed:  how much to take when to take this   tamsulosin  0.4 MG Caps capsule Commonly known as: FLOMAX  Take 0.4 mg by mouth every other day.   Vitamin D3 50 MCG (2000 UT) Tabs Take 1 tablet by mouth daily.         Procedures/Studies:   Portable Chest 1 View Result Date: 07/22/2024 CLINICAL DATA:  Syncope EXAM: PORTABLE CHEST 1 VIEW COMPARISON:  02/17/2012 FINDINGS: Numerous leads and wires project over the chest. Apical lordotic positioning. Midline trachea. Moderate cardiomegaly. No pleural effusion or pneumothorax. No congestive failure. IMPRESSION: Cardiomegaly without congestive failure. Electronically Signed   By: Rockey Kilts M.D.   On: 07/22/2024 14:00   CT GUIDED VISCERAL FLUID DRAIN BY PERC CATH Result Date: 07/18/2024 INDICATION: 74 year old with perforated appendicitis and multiple abscess. Patient already has a transgluteal pelvic drain (Drain #1). Residual abscess collections in the right lower quadrant. EXAM: 1. Image guided drain placement in a periappendiceal abscess 2. Image guided drain placement in a right lower quadrant abscess MEDICATIONS: Moderate sedation  ANESTHESIA/SEDATION: Moderate (conscious) sedation was employed during this procedure. A total of Versed  1mg  and fentanyl  50 mcg was administered intravenously at the order of the provider performing the procedure. Total intra-service moderate sedation time: 60 minutes. Patient's level of consciousness and vital signs were monitored continuously by radiology nurse throughout the procedure under the supervision of the provider performing the procedure. COMPLICATIONS: None immediate. PROCEDURE: Informed written consent was obtained from the patient after a thorough discussion of the procedural risks, benefits and alternatives. All questions were addressed. Maximal Sterile Barrier Technique was utilized including caps, mask, sterile gowns, sterile gloves, sterile drape, hand hygiene and skin antiseptic. A timeout was performed prior to the initiation of the procedure. Patient was placed supine on the CT scanner. Images through the lower abdomen and pelvis were obtained. Additional CT images were obtained following the administration intravenous contrast (75 mL Omnipaque  300). The right lower quadrant was also evaluated with ultrasound. Right lower quadrant of the abdomen was prepped with chlorhexidine and sterile field was created. Skin was anesthetized using 1% lidocaine . Small incision was made. Using ultrasound guidance, an 18 gauge needle was directed into the small fluid collection in the periappendiceal region. Thick yellow purulent fluid was aspirated. Superstiff Amplatz wire was advanced into the collection and the tract was dilated to accommodate a 10 French multipurpose drain. 10 mL of yellow/brown thick purulent fluid was aspirated. This drain was labeled as Drain #2 since the patient already has a drain in the transgluteal region. Attention was directed to an additional fluid collection more caudal in the right lower quadrant. This area was anesthetized with 1% lidocaine  and a small incision was made. Using  ultrasound guidance, an 18 gauge needle was directed into this collection. Superstiff Amplatz wire was placed. Wire placement was confirmed with ultrasound and CT. Tract was dilated to accommodate another 10 French multipurpose drain. However, no significant fluid could be aspirated from the drain and ultrasound demonstrated that the drain was not in the collection. Drain was completely removed. The right lower quadrant fluid collection was punctured again using ultrasound guidance. Wire placement was again confirmed with ultrasound guidance. The tract was dilated to accommodate a 10 French multipurpose drain. Follow up CT images demonstrated that the drain advanced through the collection. Drain was pulled back using CT and ultrasound guidance. Approximately 10 mL of thick bloody fluid was aspirated from the collection. Drain was labeled as drain #3. Both drains were flushed with  saline and attached to suction bulbs. Both drains were sutured to the skin. RADIATION DOSE REDUCTION: This exam was performed according to the departmental dose-optimization program which includes automated exposure control, adjustment of the mA and/or kV according to patient size and/or use of iterative reconstruction technique. FINDINGS: CT images demonstrated multiple small peripherally enhancing fluid collections in the lower abdomen. The collection in the periappendiceal area was targeted. Drain (#2) was placed in this collection and 10 mL of thick yellow/brown fluid was aspirated. This collection was decompressed at the end of the procedure. Second fluid collection in the right lower quadrant was targeted. Drain (#3) was placed within this collection and 10 mL of thick bloody fluid was aspirated. IMPRESSION: 1. Image guided placement of a drain within the periappendiceal abscess, labeled as drain #2. 2. Image guided placement of a drain within a right lower quadrant abscess, labeled as drain #3. Electronically Signed   By: Juliene Balder  M.D.   On: 07/18/2024 16:15   CT GUIDED VISCERAL FLUID DRAIN BY PERC CATH Result Date: 07/18/2024 INDICATION: 74 year old with perforated appendicitis and multiple abscess. Patient already has a transgluteal pelvic drain (Drain #1). Residual abscess collections in the right lower quadrant. EXAM: 1. Image guided drain placement in a periappendiceal abscess 2. Image guided drain placement in a right lower quadrant abscess MEDICATIONS: Moderate sedation ANESTHESIA/SEDATION: Moderate (conscious) sedation was employed during this procedure. A total of Versed  1mg  and fentanyl  50 mcg was administered intravenously at the order of the provider performing the procedure. Total intra-service moderate sedation time: 60 minutes. Patient's level of consciousness and vital signs were monitored continuously by radiology nurse throughout the procedure under the supervision of the provider performing the procedure. COMPLICATIONS: None immediate. PROCEDURE: Informed written consent was obtained from the patient after a thorough discussion of the procedural risks, benefits and alternatives. All questions were addressed. Maximal Sterile Barrier Technique was utilized including caps, mask, sterile gowns, sterile gloves, sterile drape, hand hygiene and skin antiseptic. A timeout was performed prior to the initiation of the procedure. Patient was placed supine on the CT scanner. Images through the lower abdomen and pelvis were obtained. Additional CT images were obtained following the administration intravenous contrast (75 mL Omnipaque  300). The right lower quadrant was also evaluated with ultrasound. Right lower quadrant of the abdomen was prepped with chlorhexidine and sterile field was created. Skin was anesthetized using 1% lidocaine . Small incision was made. Using ultrasound guidance, an 18 gauge needle was directed into the small fluid collection in the periappendiceal region. Thick yellow purulent fluid was aspirated. Superstiff  Amplatz wire was advanced into the collection and the tract was dilated to accommodate a 10 French multipurpose drain. 10 mL of yellow/brown thick purulent fluid was aspirated. This drain was labeled as Drain #2 since the patient already has a drain in the transgluteal region. Attention was directed to an additional fluid collection more caudal in the right lower quadrant. This area was anesthetized with 1% lidocaine  and a small incision was made. Using ultrasound guidance, an 18 gauge needle was directed into this collection. Superstiff Amplatz wire was placed. Wire placement was confirmed with ultrasound and CT. Tract was dilated to accommodate another 10 French multipurpose drain. However, no significant fluid could be aspirated from the drain and ultrasound demonstrated that the drain was not in the collection. Drain was completely removed. The right lower quadrant fluid collection was punctured again using ultrasound guidance. Wire placement was again confirmed with ultrasound guidance. The tract was dilated  to accommodate a 10 French multipurpose drain. Follow up CT images demonstrated that the drain advanced through the collection. Drain was pulled back using CT and ultrasound guidance. Approximately 10 mL of thick bloody fluid was aspirated from the collection. Drain was labeled as drain #3. Both drains were flushed with saline and attached to suction bulbs. Both drains were sutured to the skin. RADIATION DOSE REDUCTION: This exam was performed according to the departmental dose-optimization program which includes automated exposure control, adjustment of the mA and/or kV according to patient size and/or use of iterative reconstruction technique. FINDINGS: CT images demonstrated multiple small peripherally enhancing fluid collections in the lower abdomen. The collection in the periappendiceal area was targeted. Drain (#2) was placed in this collection and 10 mL of thick yellow/brown fluid was aspirated. This  collection was decompressed at the end of the procedure. Second fluid collection in the right lower quadrant was targeted. Drain (#3) was placed within this collection and 10 mL of thick bloody fluid was aspirated. IMPRESSION: 1. Image guided placement of a drain within the periappendiceal abscess, labeled as drain #2. 2. Image guided placement of a drain within a right lower quadrant abscess, labeled as drain #3. Electronically Signed   By: Juliene Balder M.D.   On: 07/18/2024 16:15   CT ABDOMEN PELVIS W CONTRAST Result Date: 07/16/2024 EXAM: CT ABDOMEN AND PELVIS WITH CONTRAST 07/16/2024 11:05:33 AM TECHNIQUE: CT of the abdomen and pelvis was performed with the administration of 100 mL of iohexol  (OMNIPAQUE ) 300 MG/ML solution. Multiplanar reformatted images are provided for review. Automated exposure control, iterative reconstruction, and/or weight-based adjustment of the mA/kV was utilized to reduce the radiation dose to as low as reasonably achievable. COMPARISON: CT 07/11/2024. CLINICAL HISTORY: Intra-abdominal abscess/fluid retention evaluation; Right lower abdominal pain, perforated appendicitis with intra-abdominal abscesses; status post right pelvic abscess drain via transgluteal approach on 07/12/2024. FINDINGS: LOWER CHEST: No acute abnormality. LIVER: The liver is unremarkable. GALLBLADDER AND BILE DUCTS: Gallbladder is unremarkable. No biliary ductal dilatation. SPLEEN: No acute abnormality. PANCREAS: No acute abnormality. ADRENAL GLANDS: No acute abnormality. KIDNEYS, URETERS AND BLADDER: No stones in the kidneys or ureters. No hydronephrosis. No perinephric or periureteral stranding. Urinary bladder is unremarkable. GI AND BOWEL: Stomach demonstrates no acute abnormality. The appendix is not well identified. There is probable small abscess at the tip of the cecum measuring 4.7 x 2.9 cm. Oral contrast transients the entirety of the small bowel into the ascending colon. No evidence of bowel obstruction  or perforation. Some secondary inflammation of the sigmoid colon related to the perforation. PERITONEUM AND RETROPERITONEUM: Interval placement of a transgluteal mucous catheter. There is near complete resolution of the fluid collection previously seen in the posterior cul de sac anterior to the rectum. Several smaller collections remain in the right lower quadrant. Mild enhancement collection along the right iliac vessels measures 3.2 x 2.4 cm, slight increase from 3.0 x 1.8 cm. There is interval decrease in inflammation adjacent to the supraspinatus tip. No free air. VASCULATURE: Aorta is normal in caliber. LYMPH NODES: No lymphadenopathy. REPRODUCTIVE ORGANS: No acute abnormality. BONES AND SOFT TISSUES: No acute osseous abnormality. No focal soft tissue abnormality. IMPRESSION: 1. Improvement in inflammatory / infectious process in the right lobe quadrant (perforated appendicitis) 2. Near complete resolution of the fluid collection previously seen in the posterior cul de sac anterior to the rectum following interval transgluteal drain placement. 3. Several smaller right lower quadrant collections persist, including a collection along the right iliac vessels measuring 3.2  x 2.4 cm (slightly increased from 3.0 x 1.8 cm) and a probable small abscess at the tip of the cecum measuring 4.7 x 2.9 cm. 4. No evidence of bowel obstruction or perforation. Electronically signed by: Norleen Boxer MD 07/16/2024 03:54 PM EST RP Workstation: [id number]   DG Abd Portable 1V Result Date: 07/13/2024 CLINICAL DATA:  Small bowel obstruction. EXAM: PORTABLE ABDOMEN - 1 VIEW COMPARISON:  Abdominal radiograph dated 07/12/2024. FINDINGS: Partially visualized enteric tube with tip in the proximal stomach and side-port in the region of the GE junction. Recommend further advancing by additional 5 cm. Diffusely dilated small bowel loops measure up to 5 cm in caliber. A pigtail catheter noted over the pelvis. Degenerative changes of the  spine. No acute osseous pathology. IMPRESSION: 1. Persistent small bowel obstruction. 2. Recommend further advancing the enteric tube by additional 5 cm. Electronically Signed   By: Vanetta Chou M.D.   On: 07/13/2024 13:40   ECHOCARDIOGRAM COMPLETE Result Date: 07/13/2024    ECHOCARDIOGRAM REPORT   Patient Name:   KLARK VANDERHOEF Date of Exam: 07/12/2024 Medical Rec #:  [id number]        Height:       70.0 in Accession #:    [id number]       Weight:       186.5 lb Date of Birth:  Oct 17, 1950        BSA:          2.026 m Patient Age:    73 years         BP:           113/66 mmHg Patient Gender: M                HR:           95 bpm. Exam Location:  Inpatient Procedure: 2D Echo, Cardiac Doppler and Color Doppler (Both Spectral and Color            Flow Doppler were utilized during procedure). Indications:     Abnormal ECG 194.31  History:         Patient has no prior history of Echocardiogram examinations.                  Risk Factors:Hypertension.  Sonographer:     Merlynn Argyle Referring Phys:  [id number] SUMAYYA AMIN Diagnosing Phys: Darryle Decent MD  Sonographer Comments: Image acquisition challenging due to respiratory motion. IMPRESSIONS  1. Left ventricular ejection fraction, by estimation, is 60 to 65%. The left ventricle has normal function. The left ventricle has no regional wall motion abnormalities. Indeterminate diastolic filling due to E-A fusion.  2. Right ventricular systolic function is normal. The right ventricular size is normal. There is normal pulmonary artery systolic pressure. The estimated right ventricular systolic pressure is 22.4 mmHg.  3. The mitral valve is grossly normal. Trivial mitral valve regurgitation. No evidence of mitral stenosis.  4. The aortic valve is tricuspid. Aortic valve regurgitation is mild. No aortic stenosis is present.  5. The inferior vena cava is normal in size with greater than 50% respiratory variability, suggesting right atrial pressure of 3 mmHg. FINDINGS  Left  Ventricle: Left ventricular ejection fraction, by estimation, is 60 to 65%. The left ventricle has normal function. The left ventricle has no regional wall motion abnormalities. The left ventricular internal cavity size was normal in size. There is  no left ventricular hypertrophy. Indeterminate diastolic filling due to E-A fusion. Right Ventricle: The right ventricular size  is normal. No increase in right ventricular wall thickness. Right ventricular systolic function is normal. There is normal pulmonary artery systolic pressure. The tricuspid regurgitant velocity is 2.20 m/s, and  with an assumed right atrial pressure of 3 mmHg, the estimated right ventricular systolic pressure is 22.4 mmHg. Left Atrium: Left atrial size was normal in size. Right Atrium: Right atrial size was normal in size. Pericardium: Trivial pericardial effusion is present. Mitral Valve: The mitral valve is grossly normal. Trivial mitral valve regurgitation. No evidence of mitral valve stenosis. Tricuspid Valve: The tricuspid valve is grossly normal. Tricuspid valve regurgitation is trivial. No evidence of tricuspid stenosis. Aortic Valve: The aortic valve is tricuspid. Aortic valve regurgitation is mild. No aortic stenosis is present. Pulmonic Valve: The pulmonic valve was grossly normal. Pulmonic valve regurgitation is trivial. No evidence of pulmonic stenosis. Aorta: The aortic root and ascending aorta are structurally normal, with no evidence of dilitation. Venous: The inferior vena cava is normal in size with greater than 50% respiratory variability, suggesting right atrial pressure of 3 mmHg. IAS/Shunts: The atrial septum is grossly normal.  LEFT VENTRICLE PLAX 2D LVIDd:         4.00 cm LVIDs:         3.10 cm LV PW:         1.00 cm LV IVS:        1.10 cm  RIGHT VENTRICLE          IVC RV Basal diam:  3.60 cm  IVC diam: 1.00 cm TAPSE (M-mode): 2.6 cm LEFT ATRIUM             Index        RIGHT ATRIUM           Index LA diam:        4.30 cm  2.12 cm/m   RA Area:     14.80 cm LA Vol (A2C):   35.4 ml 17.47 ml/m  RA Volume:   29.80 ml  14.71 ml/m LA Vol (A4C):   33.2 ml 16.38 ml/m LA Biplane Vol: 35.0 ml 17.27 ml/m  AORTIC VALVE             PULMONIC VALVE LVOT Vmax:   64.00 cm/s  PR End Diast Vel: 6.15 msec LVOT Vmean:  46.200 cm/s LVOT VTI:    0.117 m  AORTA Ao Root diam: 3.50 cm Ao Asc diam:  3.80 cm TRICUSPID VALVE TR Peak grad:   19.4 mmHg TR Vmax:        220.00 cm/s  SHUNTS Systemic VTI: 0.12 m Darryle Decent MD Electronically signed by Darryle Decent MD Signature Date/Time: 07/12/2024/2:09:26 PM    Final (Updated)    CT GUIDED PERITONEAL/RETROPERITONEAL FLUID DRAIN BY PERC CATH Result Date: 07/12/2024 INDICATION: 74 year old male with history of perforated appendicitis complicated by pelvic abscess formation. EXAM: CT PERC DRAIN PERITONEAL ABCESS COMPARISON:  07/11/2024 MEDICATIONS: The patient is currently admitted to the hospital and receiving intravenous antibiotics. The antibiotics were administered within an appropriate time frame prior to the initiation of the procedure. ANESTHESIA/SEDATION: Moderate (conscious) sedation was employed during this procedure. A total of Versed  1 mg and Fentanyl  50 mcg was administered intravenously. Moderate Sedation Time: 13 minutes. The patient's level of consciousness and vital signs were monitored continuously by radiology nursing throughout the procedure under my direct supervision. CONTRAST:  None COMPLICATIONS: None immediate. PROCEDURE: RADIATION DOSE REDUCTION: This exam was performed according to the departmental dose-optimization program which includes automated exposure control, adjustment of the  mA and/or kV according to patient size and/or use of iterative reconstruction technique. Informed written consent was obtained from the patient after a discussion of the risks, benefits and alternatives to treatment. The patient was placed prone on the CT gantry and a pre procedural CT was performed  re-demonstrating the known abscess/fluid collection within the pelvis. The procedure was planned. A timeout was performed prior to the initiation of the procedure. The right trans gluteal region was prepped and draped in the usual sterile fashion. The overlying soft tissues were anesthetized with 1% lidocaine  with epinephrine. Appropriate trajectory was planned with the use of a 22 gauge spinal needle. An 18 gauge trocar needle was advanced into the abscess/fluid collection and a short Amplatz super stiff wire was coiled within the collection. Appropriate positioning was confirmed with a limited CT scan. The tract was serially dilated allowing placement of a 10 French all-purpose drainage catheter. Appropriate positioning was confirmed with a limited postprocedural CT scan. Approximately 100 ml of purulent fluid was aspirated. The tube was connected to a bulb suction and sutured in place. A dressing was placed. The patient tolerated the procedure well without immediate post procedural complication. IMPRESSION: Successful CT guided placement of a right transgluteal 10 French all purpose drain catheter into the pelvic abscess with aspiration of 100 mL of purulent fluid. Samples were sent to the laboratory as requested by the ordering clinical team. Ester Sides, MD [facility] Radiology Electronically Signed   By: Ester Sides M.D.   On: 07/12/2024 15:23   DG Abd Portable 1V Result Date: 07/12/2024 CLINICAL DATA:  Nasogastric tube placement. EXAM: PORTABLE ABDOMEN - 1 VIEW COMPARISON:  Yesterday FINDINGS: Nasogastric tube tip is seen in proximal stomach. Small bowel dilatation is noted concerning for distal small bowel obstruction. IMPRESSION: Nasogastric tube tip seen in proximal stomach. Small bowel dilatation is noted concerning for distal small bowel obstruction. Electronically Signed   By: Lynwood Landy Raddle M.D.   On: 07/12/2024 12:46   CT ABDOMEN PELVIS W  CONTRAST Result Date: 07/11/2024 CLINICAL DATA:  Right lower quadrant abdominal pain. EXAM: CT ABDOMEN AND PELVIS WITH CONTRAST TECHNIQUE: Multidetector CT imaging of the abdomen and pelvis was performed using the standard protocol following bolus administration of intravenous contrast. RADIATION DOSE REDUCTION: This exam was performed according to the departmental dose-optimization program which includes automated exposure control, adjustment of the mA and/or kV according to patient size and/or use of iterative reconstruction technique. CONTRAST:  OMNIPAQUE  IOHEXOL  300 MG/ML  SOLN COMPARISON:  None Available. FINDINGS: Lower chest: The lung bases demonstrate streaky bibasilar atelectasis or scarring changes. No infiltrates or effusions. No pericardial effusion. Hepatobiliary: No focal liver abnormality is seen. No gallstones, gallbladder wall thickening, or biliary dilatation. Pancreas: Unremarkable. No pancreatic ductal dilatation or surrounding inflammatory changes. Spleen: Normal in size without focal abnormality. Adrenals/Urinary Tract: The adrenal glands and kidneys are normal. Benign bilateral parapelvic renal cysts not requiring any further imaging evaluation or follow-up. The bladder is unremarkable. Stomach/Bowel: The stomach is unremarkable. The duodenum is normal. Dilated proximal and mid small bowel loops with air-fluid levels. Transition to decompressed and inflamed appearing mid distal ileal loops of small bowel. The colon is unremarkable. No colonic lesions. Advanced sigmoid colon diverticulosis. Extensive inflammatory process in the right lower quadrant and upper right pelvis. CT findings consistent with perforated appendicitis with abdominal and pelvic abscesses. The largest right lower quadrant abscess measures 6.4 x 3.4 cm and contains a small amount non dependent gas.  The pelvic abscess is between the bladder and the rectum and measures approximately 7.3 x 5.0 cm. Multiple other smaller  likely communicating abscesses in the right lower quadrant and pelvis. Vascular/Lymphatic: Atherosclerotic calcifications involving the aorta and iliac arteries but no aneurysm. The branch vessels are patent. The major venous structures are patent. Small mesenteric and retroperitoneal lymph nodes, likely reactive. No pelvic adenopathy. Reproductive: The prostate gland and seminal vesicles are unremarkable. Bilateral hydroceles are noted. Other: No free air is identified but small locules gas are noted in the abscesses. Musculoskeletal: No significant bony findings. Moderate degenerative changes involving the spine. IMPRESSION: 1. CT findings consistent with perforated appendicitis with abdominal and pelvic abscesses. Recommend surgery consultation. 2. Associated small bowel obstruction (likely functional as opposed to mechanical). Inflamed enhancing right lower quadrant 3. Advanced sigmoid colon diverticulosis. 4. Aortic atherosclerosis. Aortic Atherosclerosis (ICD10-I70.0). Electronically Signed   By: MYRTIS Stammer M.D.   On: 07/11/2024 17:56       The results of significant diagnostics from this hospitalization (including imaging, microbiology, ancillary and laboratory) are listed below for reference.     Microbiology: Recent Results (from the past 240 hours)  Aerobic/Anaerobic Culture w Gram Stain (surgical/deep wound)     Status: None   Collection Time: 07/18/24 12:04 PM   Specimen: Abscess  Result Value Ref Range Status   Specimen Description   Final    ABSCESS Performed at [facility], [address], [place], [place] [address]    Special Requests   Final    SYRINGE A Performed at [facility], [address], [place], [place] [address]    Gram Stain   Final    ABUNDANT WBC PRESENT, PREDOMINANTLY PMN NO ORGANISMS SEEN    Culture   Final    FEW ESCHERICHIA COLI MODERATE BACTEROIDES THETAIOTAOMICRON BETA LACTAMASE POSITIVE Performed at [facility], [address], [place], [place] [address]    Report Status 07/21/2024 FINAL  Final   Organism ID, Bacteria ESCHERICHIA COLI  Final      Susceptibility   Escherichia coli - MIC*    AMPICILLIN >=32 RESISTANT Resistant     CEFAZOLIN  (NON-URINE) 4 INTERMEDIATE Intermediate     CEFEPIME <=0.12 SENSITIVE Sensitive     ERTAPENEM <=0.12 SENSITIVE Sensitive     CEFTRIAXONE  <=0.25 SENSITIVE Sensitive     CIPROFLOXACIN  <=0.06 SENSITIVE Sensitive     GENTAMICIN <=1 SENSITIVE Sensitive     MEROPENEM <=0.25 SENSITIVE Sensitive     TRIMETH/SULFA <=20 SENSITIVE Sensitive     AMPICILLIN/SULBACTAM 16 INTERMEDIATE Intermediate     PIP/TAZO Value in next row Sensitive      <=4 SENSITIVEThis is a modified FDA-approved test that has been validated and its performance characteristics determined by the reporting laboratory.  This laboratory is certified under the Clinical Laboratory Improvement Amendments CLIA as qualified to perform high complexity clinical laboratory testing.    * FEW ESCHERICHIA COLI  Aerobic/Anaerobic Culture w Gram Stain (surgical/deep wound)     Status: None (Preliminary result)   Collection Time: 07/18/24 12:32 PM   Specimen: Abscess  Result Value Ref Range Status   Specimen Description   Final    ABSCESS Performed at [facility], [address] [place], [place] [address]    Special Requests   Final    SYRINGE B Performed at [facility], [address], [place], [place] [address]    Gram Stain   Final    ABUNDANT WBC  PRESENT, PREDOMINANTLY PMN NO ORGANISMS SEEN Performed at [facility], [address], [place], [place] [address]    Culture   Final    RARE ESCHERICHIA COLI SUSCEPTIBILITIES PERFORMED ON PREVIOUS CULTURE WITHIN THE LAST 5 DAYS. NO ANAEROBES ISOLATED; CULTURE IN PROGRESS FOR 5 DAYS    Report Status PENDING  Incomplete     Labs:  CBC: Recent Labs  Lab 07/17/24 0408 07/18/24 0351 07/19/24 0740  07/22/24 1029 07/23/24 0402  WBC 11.7* 11.5* 9.5 9.7 8.6  NEUTROABS  --   --   --  7.7  --   HGB 12.9* 12.7* 13.2 13.3 12.8*  HCT 38.9* 38.1* 39.3 40.1 39.6  MCV 97.7 97.4 98.5 98.3 99.2  PLT 289 302 331 378 404*   BMP &GFR Recent Labs  Lab 07/19/24 0740 07/22/24 1029 07/22/24 1636 07/23/24 0402  NA 136 138  --  135  K 4.1 3.8  --  4.5  CL 103 102  --  103  CO2 25 27  --  22  GLUCOSE 119* 122*  --  104*  BUN 6* 7*  --  6*  CREATININE 0.98 0.94  --  0.90  CALCIUM 8.8* 9.5  --  9.4  MG  --   --  1.8  --   PHOS  --   --  2.4*  --    Estimated Creatinine Clearance: 74.4 mL/min (by C-G formula based on SCr of 0.9 mg/dL). Liver & Pancreas: Recent Labs  Lab 07/19/24 0740 07/22/24 1029 07/23/24 0402  AST 29 43* 36  ALT 35 41 35  ALKPHOS 54 51 50  BILITOT 0.4 0.5 0.5  PROT 5.8* 6.2* 6.1*  ALBUMIN 3.1* 3.4* 3.1*   No results for input(s): LIPASE, AMYLASE in the last 168 hours. No results for input(s): AMMONIA in the last 168 hours. Diabetic: No results for input(s): HGBA1C in the last 72 hours. No results for input(s): GLUCAP in the last 168 hours. Cardiac Enzymes: No results for input(s): CKTOTAL, CKMB, CKMBINDEX, TROPONINI in the last 168 hours. No results for input(s): PROBNP in the last 8760 hours. Coagulation Profile: No results for input(s): INR, PROTIME in the last 168 hours. Thyroid  Function Tests: No results for input(s): TSH, T4TOTAL, FREET4, T3FREE, THYROIDAB in the last 72 hours. Lipid Profile: No results for input(s): CHOL, HDL, LDLCALC, TRIG, CHOLHDL, LDLDIRECT in the last 72 hours. Anemia Panel: No results for input(s): VITAMINB12, FOLATE, FERRITIN, TIBC, IRON, RETICCTPCT in the last 72 hours. Urine analysis:    Component Value Date/Time   COLORURINE AMBER (A) 07/11/2024 1703   APPEARANCEUR HAZY (A) 07/11/2024 1703   LABSPEC >1.046 (H) 07/11/2024 1703   PHURINE 5.0 07/11/2024 1703    GLUCOSEU NEGATIVE 07/11/2024 1703   HGBUR NEGATIVE 07/11/2024 1703   BILIRUBINUR NEGATIVE 07/11/2024 1703   KETONESUR NEGATIVE 07/11/2024 1703   PROTEINUR 100 (A) 07/11/2024 1703   NITRITE NEGATIVE 07/11/2024 1703   LEUKOCYTESUR NEGATIVE 07/11/2024 1703   Sepsis Labs: Invalid input(s): PROCALCITONIN, LACTICIDVEN   SIGNED:  Stephany Poorman T Allene Furuya, MD  [facility] 07/23/2024, 12:49 PM   "

## 2024-07-23 NOTE — Progress Notes (Signed)
 Brief Interventional Radiology Note:  Patient requested to see IR provider at bedside to discuss possible drain removal. Patient previously underwent right transgluteal drain placement in IR on 1/13 secondary to perforated appendicitis. He unfortunately required 2 additional drain placements on 1/19 due to intra-abdominal abscesses. Patient was admitted to the hospital overnight secondary to a syncopal episode. States he was supposed to present to clinic for his drain follow up on Friday, but was unable to make it to his appointment as he was taken to the ED d/t syncope instead.   Discussed with patient that his drain output is reassuring; however, he will require repeat imaging prior to drain removal to ensure resolution of fluid collections. Per team, patient will likely be discharged later today.   Patient will also require drain injection given his history of perforation. IR is unfortunately not able to accommodate this on the weekend due to staffing. Team is aware. Patient will follow up in clinic for repeat CT A/P with contrast and drain injection in the next 7-10 days.   Please feel free to reach out to IR with any additional questions or concerns.   Electronically Signed: Estalee Mccandlish M Caydn Justen, PA-C 07/23/2024, 12:35 PM '

## 2024-07-26 ENCOUNTER — Telehealth: Payer: Self-pay | Admitting: *Deleted

## 2024-07-26 ENCOUNTER — Other Ambulatory Visit (HOSPITAL_COMMUNITY): Payer: Self-pay

## 2024-07-26 NOTE — Telephone Encounter (Signed)
 I apologize for the delay in delivery your ZIO monitor. I looked into your monitor order to see the status.  Everything looked fine regarding the processing of your order, but I was not able to pull you up on the [employer] website.  I went through all the steps again, which, did transmit to the [employer] website.  I will contact our [employer] representative to have them expedite the delivery of your monitor.  You should receive it in 2 business days.

## 2024-07-26 NOTE — Telephone Encounter (Signed)
-----   Message from Seneca C sent at 07/26/2024  2:11 PM EST ----- Regarding: Zio monitor Patient called to follow up on Zio monitor please advise   Thank you

## 2024-07-28 ENCOUNTER — Encounter: Payer: Self-pay | Admitting: Radiology

## 2024-07-29 ENCOUNTER — Ambulatory Visit
Admission: RE | Admit: 2024-07-29 | Discharge: 2024-07-29 | Disposition: A | Source: Ambulatory Visit | Attending: Surgery | Admitting: Surgery

## 2024-07-29 ENCOUNTER — Other Ambulatory Visit: Payer: Self-pay | Admitting: Surgery

## 2024-07-29 ENCOUNTER — Ambulatory Visit
Admission: RE | Admit: 2024-07-29 | Discharge: 2024-07-29 | Disposition: A | Source: Ambulatory Visit | Attending: Radiology

## 2024-07-29 ENCOUNTER — Other Ambulatory Visit

## 2024-07-29 DIAGNOSIS — K3533 Acute appendicitis with perforation and localized peritonitis, with abscess: Secondary | ICD-10-CM

## 2024-07-29 MED ORDER — IOPAMIDOL (ISOVUE-300) INJECTION 61%
100.0000 mL | Freq: Once | INTRAVENOUS | Status: AC | PRN
  Administered 2024-07-29: 100 mL via INTRAVENOUS

## 2024-07-29 MED ORDER — IOPAMIDOL (ISOVUE-300) INJECTION 61%
30.0000 mL | Freq: Once | INTRAVENOUS | Status: AC | PRN
  Administered 2024-07-29: 30 mL
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
## 2024-08-12 ENCOUNTER — Other Ambulatory Visit

## 2024-08-22 ENCOUNTER — Ambulatory Visit: Admitting: Emergency Medicine

## 2024-10-03 ENCOUNTER — Ambulatory Visit: Admitting: Cardiovascular Disease

## 2024-11-23 ENCOUNTER — Ambulatory Visit: Payer: Self-pay | Admitting: Neurology
